# Patient Record
Sex: Female | Born: 2000 | Race: Black or African American | Hispanic: No | Marital: Single
Health system: Southern US, Community
[De-identification: ages and names within clinical notes are randomized; demographics above are authoritative.]

## PROBLEM LIST (undated history)

## (undated) ENCOUNTER — Emergency Department (HOSPITAL_COMMUNITY)

## (undated) DIAGNOSIS — F329 Major depressive disorder, single episode, unspecified: Secondary | ICD-10-CM

## (undated) DIAGNOSIS — F4320 Adjustment disorder, unspecified: Secondary | ICD-10-CM

## (undated) DIAGNOSIS — F909 Attention-deficit hyperactivity disorder, unspecified type: Secondary | ICD-10-CM

## (undated) DIAGNOSIS — Z7289 Other problems related to lifestyle: Secondary | ICD-10-CM

## (undated) DIAGNOSIS — R45851 Suicidal ideations: Secondary | ICD-10-CM

## (undated) DIAGNOSIS — N390 Urinary tract infection, site not specified: Secondary | ICD-10-CM

## (undated) HISTORY — DX: Adjustment disorder, unspecified: F43.20

## (undated) HISTORY — DX: Attention-deficit hyperactivity disorder, unspecified type: F90.9

---
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
## 2022-01-13 ENCOUNTER — Ambulatory Visit: Payer: Medicaid Other | Admitting: Pediatrics

## 2022-04-09 ENCOUNTER — Encounter (HOSPITAL_COMMUNITY): Payer: Self-pay | Admitting: Emergency Medicine

## 2022-04-09 ENCOUNTER — Emergency Department (HOSPITAL_COMMUNITY)
Admission: EM | Admit: 2022-04-09 | Discharge: 2022-04-09 | Disposition: A | Discharge status: Home / Self Care | Payer: Medicaid Other | Attending: Emergency Medicine | Admitting: Emergency Medicine

## 2022-04-09 ENCOUNTER — Emergency Department (HOSPITAL_COMMUNITY): Payer: Medicaid Other

## 2022-04-09 ENCOUNTER — Other Ambulatory Visit: Payer: Self-pay

## 2022-04-09 DIAGNOSIS — R0981 Nasal congestion: Secondary | ICD-10-CM | POA: Diagnosis not present

## 2022-04-09 DIAGNOSIS — R1031 Right lower quadrant pain: Secondary | ICD-10-CM | POA: Diagnosis not present

## 2022-04-09 DIAGNOSIS — R059 Cough, unspecified: Secondary | ICD-10-CM | POA: Diagnosis not present

## 2022-04-09 DIAGNOSIS — D252 Subserosal leiomyoma of uterus: Secondary | ICD-10-CM | POA: Diagnosis not present

## 2022-04-09 DIAGNOSIS — R1030 Lower abdominal pain, unspecified: Secondary | ICD-10-CM | POA: Diagnosis not present

## 2022-04-09 DIAGNOSIS — R0989 Other specified symptoms and signs involving the circulatory and respiratory systems: Secondary | ICD-10-CM | POA: Diagnosis not present

## 2022-04-09 DIAGNOSIS — Z20822 Contact with and (suspected) exposure to covid-19: Secondary | ICD-10-CM | POA: Diagnosis not present

## 2022-04-09 DIAGNOSIS — R197 Diarrhea, unspecified: Secondary | ICD-10-CM | POA: Diagnosis not present

## 2022-04-09 DIAGNOSIS — N83201 Unspecified ovarian cyst, right side: Secondary | ICD-10-CM

## 2022-04-09 DIAGNOSIS — K921 Melena: Secondary | ICD-10-CM | POA: Insufficient documentation

## 2022-04-09 DIAGNOSIS — R110 Nausea: Secondary | ICD-10-CM | POA: Insufficient documentation

## 2022-04-09 LAB — URINALYSIS, ROUTINE W REFLEX MICROSCOPIC
Bilirubin Urine: NEGATIVE
Glucose, UA: NEGATIVE mg/dL
Hgb urine dipstick: NEGATIVE
Ketones, ur: NEGATIVE mg/dL
Leukocytes,Ua: NEGATIVE
Nitrite: NEGATIVE
Protein, ur: NEGATIVE mg/dL
Specific Gravity, Urine: 1.016 (ref 1.005–1.030)
pH: 6 (ref 5.0–8.0)

## 2022-04-09 LAB — CBC
HCT: 35.2 % — ABNORMAL LOW (ref 36.0–46.0)
Hemoglobin: 12.3 g/dL (ref 12.0–15.0)
MCH: 24.7 pg — ABNORMAL LOW (ref 26.0–34.0)
MCHC: 34.9 g/dL (ref 30.0–36.0)
MCV: 70.7 fL — ABNORMAL LOW (ref 80.0–100.0)
Platelets: 400 10*3/uL (ref 150–400)
RBC: 4.98 MIL/uL (ref 3.87–5.11)
RDW: 15.9 % — ABNORMAL HIGH (ref 11.5–15.5)
WBC: 8.2 10*3/uL (ref 4.0–10.5)
nRBC: 0 % (ref 0.0–0.2)

## 2022-04-09 LAB — COMPREHENSIVE METABOLIC PANEL
ALT: 12 U/L (ref 0–44)
AST: 14 U/L — ABNORMAL LOW (ref 15–41)
Albumin: 3.8 g/dL (ref 3.5–5.0)
Alkaline Phosphatase: 56 U/L (ref 38–126)
Anion gap: 8 (ref 5–15)
BUN: 7 mg/dL (ref 6–20)
CO2: 23 mmol/L (ref 22–32)
Calcium: 9.6 mg/dL (ref 8.9–10.3)
Chloride: 107 mmol/L (ref 98–111)
Creatinine, Ser: 0.76 mg/dL (ref 0.44–1.00)
GFR, Estimated: >60 mL/min (ref >60)
Glucose, Bld: 92 mg/dL (ref 70–99)
Potassium: 4.4 mmol/L (ref 3.5–5.1)
Sodium: 138 mmol/L (ref 135–145)
Total Bilirubin: 0.4 mg/dL (ref 0.3–1.2)
Total Protein: 6.9 g/dL (ref 6.5–8.1)

## 2022-04-09 LAB — I-STAT BETA HCG BLOOD, ED (MC, WL, AP ONLY): I-stat hCG, quantitative: <5 m[IU]/mL (ref <5)

## 2022-04-09 LAB — SARS CORONAVIRUS 2 BY RT PCR: SARS Coronavirus 2 by RT PCR: NEGATIVE

## 2022-04-09 LAB — POC OCCULT BLOOD, ED: Fecal Occult Bld: NEGATIVE

## 2022-04-09 LAB — LIPASE, BLOOD: Lipase: 29 U/L (ref 11–51)

## 2022-04-09 MED ORDER — SODIUM CHLORIDE 0.9 % IV BOLUS
1000.0000 mL | Freq: Once | INTRAVENOUS | Status: AC
  Administered 2022-04-09: 1000 mL via INTRAVENOUS

## 2022-04-09 MED ORDER — ONDANSETRON HCL 4 MG/2ML IJ SOLN
4.0000 mg | Freq: Once | INTRAMUSCULAR | Status: AC
  Administered 2022-04-09: 4 mg via INTRAVENOUS
  Filled 2022-04-09: qty 2

## 2022-04-09 MED ORDER — DICYCLOMINE HCL 10 MG PO CAPS
10.0000 mg | ORAL_CAPSULE | Freq: Once | ORAL | Status: AC
  Administered 2022-04-09: 10 mg via ORAL
  Filled 2022-04-09: qty 1

## 2022-04-09 MED ORDER — IOHEXOL 300 MG/ML  SOLN
100.0000 mL | Freq: Once | INTRAMUSCULAR | Status: AC | PRN
Start: 2022-04-09 — End: 2022-04-09
  Administered 2022-04-09: 100 mL via INTRAVENOUS

## 2022-04-09 NOTE — Discharge Instructions (Addendum)
You were seen in the ER for evaluation of your abdominal pain.  You have a large hemorrhagic cyst on your right ovary which is likely causing your pain.  This will cause pain for the next few days to up to a week.  For pain, you can take Tylenol 1000 mg and ibuprofen 600 mg every 6 hours as needed for pain.  You can also apply heating pads and warm compresses to the area to help with pain.  I given you a work note for the next few days.  If you have worsening pain, pain with urination, blood in your urine, nausea, vomiting, fever, please return to the nearest emergency department for reevaluation.  Contact a doctor if: Your periods: Are late. Are not regular. Stop. Are painful. You have pain in the area between your hip bones, and the pain does not go away. You feel pressure on your bladder. You have trouble peeing. You feel full, or your belly hurts, swells, or bloats. You gain or lose weight without trying, or you are less hungry than normal. You feel pain and pressure in your back. You feel pain and pressure in the area between your hip bones. You think you may be pregnant. Get help right away if: You have pain in your belly that is very bad or gets worse. You have pain in the area between your hip bones, and the pain is very bad or gets worse. You cannot eat or drink without vomiting. You get a fever or chills all of a sudden. Your period is a lot heavier than usual.

## 2022-04-09 NOTE — ED Provider Notes (Signed)
Physical Exam  BP 121/80   Pulse 66   Temp 97.8 F (36.6 C) (Oral)   Resp 17   Ht 4\' 11"  (1.499 m)   Wt 83.9 kg   LMP 03/17/2022   SpO2 100%   Breastfeeding Unknown   BMI 37.37 kg/m   Physical Exam Vitals and nursing note reviewed.  Constitutional:      Appearance: Normal appearance.  Eyes:     General: No scleral icterus. Pulmonary:     Effort: Pulmonary effort is normal. No respiratory distress.  Abdominal:     Tenderness: There is abdominal tenderness in the right lower quadrant. There is no guarding or rebound.  Skin:    General: Skin is dry.     Findings: No rash.  Neurological:     General: No focal deficit present.     Mental Status: She is alert. Mental status is at baseline.  Psychiatric:        Mood and Affect: Mood normal.     Procedures  Procedures  ED Course / MDM   Clinical Course as of 04/09/22 1715  Sun Apr 09, 2022  1427 Pt re-evaluated and noted improvement of symptoms with treatment regimen in the ED. [SB]  1530 Discussed with patient plans for ultrasound, answered all available questions.  Patient agreeable at this time. [SB]    Clinical Course User Index [SB] Blue, Soijett A, PA-C   Medical Decision Making Amount and/or Complexity of Data Reviewed Labs: ordered. Radiology: ordered.  Risk Prescription drug management.   Accepted handoff at shift change from [facility]. Please see prior provider note for more detail.   Briefly: Patient is 21 y.o. F presenting with URI and lower abdominal pain.  DDX: concern for ovarian cyst.  Plan: F/u on 12 imaging.   Ultrasound imaging shows 3.9 cm hemorrhagic cyst in the right ovary Nonspecific small volume free fluid in the pelvis, likely physiologic.  Spoke with Dr. [place] with OBGYN that reports that she was safe for discharge.   Stable vital signs, no anemia.  No electrolyte abnormality.  hCG negative.  COVID-negative.  Patient's well-appearing with stable vital signs, safe for  discharge.  I discussed the patient's imaging with her.  Discussed that this will likely cause pain after a few days to a week.  She can take Tylenol or ibuprofen for the pain and apply warm compresses.  We discussed with her to follow-up with her PCP within the next week.  We discussed return precautions red flag symptoms.  Patient verbalized understanding and agrees to the plan.  Patient is stable being discharged home in good condition.   Results for orders placed or performed during the hospital encounter of 04/09/22  SARS Coronavirus 2 by RT PCR (hospital order, performed in [facility] hospital lab) *cepheid single result test* Anterior Nasal Swab   Specimen: Anterior Nasal Swab  Result Value Ref Range   SARS Coronavirus 2 by RT PCR NEGATIVE NEGATIVE  Lipase, blood  Result Value Ref Range   Lipase 29 11 - 51 U/L  Comprehensive metabolic panel  Result Value Ref Range   Sodium 138 135 - 145 mmol/L   Potassium 4.4 3.5 - 5.1 mmol/L   Chloride 107 98 - 111 mmol/L   CO2 23 22 - 32 mmol/L   Glucose, Bld 92 70 - 99 mg/dL   BUN 7 6 - 20 mg/dL   Creatinine, Ser [facility] 0.44 - 1.00 mg/dL   Calcium 9.6 8.9 - 0.62 mg/dL  Total Protein 6.9 6.5 - 8.1 g/dL   Albumin 3.8 3.5 - 5.0 g/dL   AST 14 (L) 15 - 41 U/L   ALT 12 0 - 44 U/L   Alkaline Phosphatase 56 38 - 126 U/L   Total Bilirubin 0.4 0.3 - 1.2 mg/dL   GFR, Estimated >84 >66 mL/min   Anion gap 8 5 - 15  CBC  Result Value Ref Range   WBC 8.2 4.0 - 10.5 K/uL   RBC 4.98 3.87 - 5.11 MIL/uL   Hemoglobin 12.3 12.0 - 15.0 g/dL   HCT 59.9 (L) 35.7 - 01.7 %   MCV 70.7 (L) 80.0 - 100.0 fL   MCH 24.7 (L) 26.0 - 34.0 pg   MCHC 34.9 30.0 - 36.0 g/dL   RDW 79.3 (H) 90.3 - 00.9 %   Platelets 400 150 - 400 K/uL   nRBC 0.0 0.0 - 0.2 %  Urinalysis, Routine w reflex microscopic Urine, Clean Catch  Result Value Ref Range   Color, Urine YELLOW YELLOW   APPearance HAZY (A) CLEAR   Specific Gravity, Urine 1.016 1.005 - 1.030   pH 6.0 5.0 - 8.0    Glucose, UA NEGATIVE NEGATIVE mg/dL   Hgb urine dipstick NEGATIVE NEGATIVE   Bilirubin Urine NEGATIVE NEGATIVE   Ketones, ur NEGATIVE NEGATIVE mg/dL   Protein, ur NEGATIVE NEGATIVE mg/dL   Nitrite NEGATIVE NEGATIVE   Leukocytes,Ua NEGATIVE NEGATIVE  I-Stat beta hCG blood, ED  Result Value Ref Range   I-stat hCG, quantitative <5.0 <5 mIU/mL   Comment 3          POC occult blood, ED  Result Value Ref Range   Fecal Occult Bld NEGATIVE NEGATIVE   US Pelvis Complete  Result Date: 04/09/2022 CLINICAL DATA:  Lower abdominal pain EXAM: TRANSABDOMINAL AND TRANSVAGINAL ULTRASOUND OF PELVIS TECHNIQUE: Both transabdominal and transvaginal ultrasound examinations of the pelvis were performed. Transabdominal technique was performed for global imaging of the pelvis including uterus, ovaries, adnexal regions, and pelvic cul-de-sac. It was necessary to proceed with endovaginal exam following the transabdominal exam to visualize the ovaries/adnexa and uterus/endometrium. COMPARISON:  Ultrasound 12/12/2021 FINDINGS: Uterus Measurements: 7.5 x 3.2 x 4.1 cm = volume: 52.3 mL. No fibroids or other mass visualized. Endometrium Thickness: 13.4 mm. There is a 6 mm subserosal uterine fibroid at the posterior fundus. Right ovary Measurements: 5.6 x 4.0 x 4.3 cm = volume: 49.1 mL. There is a 3.9 x 3.3 x 3.3 cm complex cyst with internal reticular echoes and a probable fluid-fluid level consistent with a hemorrhagic cyst. Left ovary Measurements: 4.9 x 1.6 x 1.9 cm = volume: 7.4 mL. Normal appearance/no adnexal mass. Other findings Small volume nonspecific free fluid in the pelvis. Fluid appears simple. IMPRESSION: 3.9 cm hemorrhagic cyst in the right ovary Nonspecific small volume free fluid in the pelvis, likely physiologic. Electronically Signed   By: Caprice Renshaw M.D.   On: 04/09/2022 16:51   US Transvaginal Non-OB  Result Date: 04/09/2022 CLINICAL DATA:  Lower abdominal pain EXAM: TRANSABDOMINAL AND TRANSVAGINAL  ULTRASOUND OF PELVIS TECHNIQUE: Both transabdominal and transvaginal ultrasound examinations of the pelvis were performed. Transabdominal technique was performed for global imaging of the pelvis including uterus, ovaries, adnexal regions, and pelvic cul-de-sac. It was necessary to proceed with endovaginal exam following the transabdominal exam to visualize the ovaries/adnexa and uterus/endometrium. COMPARISON:  Ultrasound 12/12/2021 FINDINGS: Uterus Measurements: 7.5 x 3.2 x 4.1 cm = volume: 52.3 mL. No fibroids or other mass visualized. Endometrium Thickness: 13.4 mm. There  is a 6 mm subserosal uterine fibroid at the posterior fundus. Right ovary Measurements: 5.6 x 4.0 x 4.3 cm = volume: 49.1 mL. There is a 3.9 x 3.3 x 3.3 cm complex cyst with internal reticular echoes and a probable fluid-fluid level consistent with a hemorrhagic cyst. Left ovary Measurements: 4.9 x 1.6 x 1.9 cm = volume: 7.4 mL. Normal appearance/no adnexal mass. Other findings Small volume nonspecific free fluid in the pelvis. Fluid appears simple. IMPRESSION: 3.9 cm hemorrhagic cyst in the right ovary Nonspecific small volume free fluid in the pelvis, likely physiologic. Electronically Signed   By: Caprice Renshaw M.D.   On: 04/09/2022 16:51   CT ABDOMEN PELVIS W CONTRAST  Result Date: 04/09/2022 CLINICAL DATA:  Lower abdominal pain and diarrhea for 1 week, dysuria EXAM: CT ABDOMEN AND PELVIS WITH CONTRAST TECHNIQUE: Multidetector CT imaging of the abdomen and pelvis was performed using the standard protocol following bolus administration of intravenous contrast. RADIATION DOSE REDUCTION: This exam was performed according to the departmental dose-optimization program which includes automated exposure control, adjustment of the mA and/or kV according to patient size and/or use of iterative reconstruction technique. CONTRAST:  OMNIPAQUE IOHEXOL 300 MG/ML  SOLN COMPARISON:  None Available. FINDINGS: Lower chest: No acute abnormality.  Hepatobiliary: No solid liver abnormality is seen. No gallstones, gallbladder wall thickening, or biliary dilatation. Pancreas: Unremarkable. No pancreatic ductal dilatation or surrounding inflammatory changes. Spleen: Normal in size without significant abnormality. Adrenals/Urinary Tract: Adrenal glands are unremarkable. Kidneys are normal, without renal calculi, solid lesion, or hydronephrosis. Bladder is unremarkable. Stomach/Bowel: Stomach is within normal limits. Appendix appears normal. No evidence of bowel wall thickening, distention, or inflammatory changes. Vascular/Lymphatic: No significant vascular findings are present. No enlarged abdominal or pelvic lymph nodes. Reproductive: No mass mass. Right ovarian cyst measuring 3.6 x 3.2 cm, with layering internal fluid fluid level (series 3, image 72, series 6, image 48). Other: No abdominal wall hernia or abnormality. Small volume free fluid in the low pelvis (series 3, image 75). Musculoskeletal: No acute or significant osseous findings. IMPRESSION: 1. Right ovarian cyst measuring 3.6 x 3.2 cm, with layering internal fluid fluid level. This may represent a hemorrhagic cyst or endometrioma and can be further evaluated by ultrasound if current presentation of abdominal pain is felt referable. 2. Small volume free fluid in the low pelvis, likely physiologic in the reproductive age setting. 3. No other findings to explain pain. Electronically Signed   By: Jearld Lesch M.D.   On: 04/09/2022 15:14          Achille Rich, PA-C 04/09/22 1846    Ernie Avena, MD 04/09/22 [phone]

## 2022-04-09 NOTE — ED Provider Notes (Signed)
[facility] EMERGENCY DEPARTMENT Provider Note   CSN: [id number] Arrival date & time: 04/09/22  1137     History  Chief Complaint  Patient presents with   Abdominal Pain    Angela Lara is a 21 y.o. female who presents to the emergency department with concerns for lower abdominal cramping onset 1 week.  Notes that she has sick contacts at home with her family members.  Has associated nasal congestion, rhinorrhea, cough, diarrhea, nausea.  Has tried TheraFlu at home with relief of her symptoms.  She also notes an episode of blood in the stool with wiping.  Denies history of hemorrhoids.  Denies vaginal bleeding, vaginal discharge, fever, hematuria.   The history is provided by the patient. No language interpreter was used.       Home Medications Prior to Admission medications   Medication Sig Start Date End Date Taking? Authorizing Provider  ibuprofen (ADVIL) 200 MG tablet Take 200 mg by mouth every 6 (six) hours as needed for moderate pain.   Yes [provider]  ibuprofen (ADVIL) 800 MG tablet Take 1 tablet (800 mg total) by mouth 3 (three) times daily with meals as needed for headache, moderate pain or cramping. Patient not taking: Reported on 04/09/2022 12/12/21   Milas Hock, MD  oxyCODONE (ROXICODONE) 5 MG immediate release tablet Take 1 tablet (5 mg total) by mouth every 4 (four) hours as needed for severe pain. Patient not taking: Reported on 04/09/2022 12/12/21   Milas Hock, MD      Allergies    Rosanne Ashing allergy]    Review of Systems   Review of Systems  Constitutional:  Negative for fever.  HENT:  Positive for congestion and rhinorrhea.   Respiratory:  Positive for cough.   Gastrointestinal:  Positive for abdominal pain, blood in stool, diarrhea and nausea. Negative for vomiting.  Genitourinary:  Positive for dysuria. Negative for hematuria, vaginal bleeding and vaginal discharge.  All other systems reviewed and are  negative.   Physical Exam Updated Vital Signs BP (!) 131/95 (BP Location: Right Arm)   Pulse 87   Temp 98.5 F (36.9 C) (Oral)   Resp 16   Ht 4\' 11"  (1.499 m)   Wt 83.9 kg   LMP 03/17/2022   SpO2 100%   Breastfeeding Unknown   BMI 37.37 kg/m  Physical Exam Vitals and nursing note reviewed.  Constitutional:      General: She is not in acute distress.    Appearance: She is not diaphoretic.  HENT:     Head: Normocephalic and atraumatic.     Mouth/Throat:     Pharynx: No oropharyngeal exudate.  Eyes:     General: No scleral icterus.    Conjunctiva/sclera: Conjunctivae normal.  Cardiovascular:     Rate and Rhythm: Normal rate and regular rhythm.     Pulses: Normal pulses.     Heart sounds: Normal heart sounds.  Pulmonary:     Effort: Pulmonary effort is normal. No respiratory distress.     Breath sounds: Normal breath sounds. No wheezing.  Abdominal:     General: Bowel sounds are normal.     Palpations: Abdomen is soft. There is no mass.     Tenderness: There is no abdominal tenderness. There is no guarding or rebound.     Comments: Diffuse abdominal tenderness to palpation.  Musculoskeletal:        General: Normal range of motion.     Cervical back: Normal range of motion and  neck supple.  Skin:    General: Skin is warm and dry.  Neurological:     Mental Status: She is alert.  Psychiatric:        Behavior: Behavior normal.     ED Results / Procedures / Treatments   Labs (all labs ordered are listed, but only abnormal results are displayed) Labs Reviewed  COMPREHENSIVE METABOLIC PANEL - Abnormal; Notable for the following components:      Result Value   AST 14 (*)    All other components within normal limits  CBC - Abnormal; Notable for the following components:   HCT 35.2 (*)    MCV 70.7 (*)    MCH 24.7 (*)    RDW 15.9 (*)    All other components within normal limits  URINALYSIS, ROUTINE W REFLEX MICROSCOPIC - Abnormal; Notable for the following  components:   APPearance HAZY (*)    All other components within normal limits  SARS CORONAVIRUS 2 BY RT PCR  LIPASE, BLOOD  I-STAT BETA HCG BLOOD, ED ([facility], [facility], [facility] ONLY)  POC OCCULT BLOOD, ED    EKG None  Radiology CT ABDOMEN PELVIS W CONTRAST  Result Date: 04/09/2022 CLINICAL DATA:  Lower abdominal pain and diarrhea for 1 week, dysuria EXAM: CT ABDOMEN AND PELVIS WITH CONTRAST TECHNIQUE: Multidetector CT imaging of the abdomen and pelvis was performed using the standard protocol following bolus administration of intravenous contrast. RADIATION DOSE REDUCTION: This exam was performed according to the departmental dose-optimization program which includes automated exposure control, adjustment of the mA and/or kV according to patient size and/or use of iterative reconstruction technique. CONTRAST:  OMNIPAQUE IOHEXOL 300 MG/ML  SOLN COMPARISON:  None Available. FINDINGS: Lower chest: No acute abnormality. Hepatobiliary: No solid liver abnormality is seen. No gallstones, gallbladder wall thickening, or biliary dilatation. Pancreas: Unremarkable. No pancreatic ductal dilatation or surrounding inflammatory changes. Spleen: Normal in size without significant abnormality. Adrenals/Urinary Tract: Adrenal glands are unremarkable. Kidneys are normal, without renal calculi, solid lesion, or hydronephrosis. Bladder is unremarkable. Stomach/Bowel: Stomach is within normal limits. Appendix appears normal. No evidence of bowel wall thickening, distention, or inflammatory changes. Vascular/Lymphatic: No significant vascular findings are present. No enlarged abdominal or pelvic lymph nodes. Reproductive: No mass mass. Right ovarian cyst measuring 3.6 x 3.2 cm, with layering internal fluid fluid level (series 3, image 72, series 6, image 48). Other: No abdominal wall hernia or abnormality. Small volume free fluid in the low pelvis (series 3, image 75). Musculoskeletal: No acute or significant osseous findings.  IMPRESSION: 1. Right ovarian cyst measuring 3.6 x 3.2 cm, with layering internal fluid fluid level. This may represent a hemorrhagic cyst or endometrioma and can be further evaluated by ultrasound if current presentation of abdominal pain is felt referable. 2. Small volume free fluid in the low pelvis, likely physiologic in the reproductive age setting. 3. No other findings to explain pain. Electronically Signed   By: Jearld Lesch M.D.   On: 04/09/2022 15:14    Procedures Procedures    Medications Ordered in ED Medications  dicyclomine (BENTYL) capsule 10 mg (10 mg Oral Given 04/09/22 1336)  ondansetron (ZOFRAN) injection 4 mg (4 mg Intravenous Given 04/09/22 1333)  sodium chloride 0.9 % bolus 1,000 mL (1,000 mLs Intravenous New Bag/Given 04/09/22 1333)  iohexol (OMNIPAQUE) 300 MG/ML solution 100 mL (100 mLs Intravenous Contrast Given 04/09/22 1503)    ED Course/ Medical Decision Making/ A&P Clinical Course as of 04/09/22 1547  Sun Apr 09, 2022  1427  Pt re-evaluated and noted improvement of symptoms with treatment regimen in the ED. [SB]  1530 Discussed with patient plans for ultrasound, answered all available questions.  Patient agreeable at this time. [SB]    Clinical Course User Index [SB] Haidar Muse A, PA-C                           Medical Decision Making Amount and/or Complexity of Data Reviewed Labs: ordered. Radiology: ordered.  Risk Prescription drug management.   Patient presents to the emergency department with lower abdominal pain onset 1 week.  Has associated diarrhea.  Notes feeling members at home sick with similar symptoms.  Has tried ibuprofen without relief of her symptoms.  Also complains of URI symptoms and has tried TheraFlu with relief of her symptoms.  Patient afebrile.  On exam patient with diffuse abdominal tenderness to palpation, more so to the lower abdominal regions.  No acute cardiovascular respiratory exam findings.  Differential diagnosis includes  appendicitis, acute cystitis, ovarian cyst, URI, COVID.   Labs:  I ordered, and personally interpreted labs.  The pertinent results include:  Lipase unremarkable at 29 CBC without leukocytosis and unremarkable CMP unremarkable I-stat beta hCG negative COVID swab negative Hemoccult negative  Imaging: I ordered imaging studies including CT abdomen pelvis with I independently visualized and interpreted imaging which showed  1. Right ovarian cyst measuring 3.6 x 3.2 cm, with layering internal  fluid fluid level. This may represent a hemorrhagic cyst or  endometrioma and can be further evaluated by ultrasound if current  presentation of abdominal pain is felt referable.  2. Small volume free fluid in the low pelvis, likely physiologic in  the reproductive age setting.  3. No other findings to explain pain.   US Pelvis, transvaginal ordered with results pending at time of sign-out.  I agree with the radiologist interpretation  Medications:  I ordered medication including bentyl, zofran, IVF for symptom management.  Reevaluation of the patient after these medicines and interventions, I reevaluated the patient and found that they have improved I have reviewed the patients home medicines and have made adjustments as needed  Patient case discussed with Achille Rich, PA-C at sign-out. Plan at sign-out is pending [place] and pain management, likely Discharge home, however, plans may change as per oncoming team. Patient care transferred at sign out.    This chart was dictated using voice recognition software, Dragon. Despite the best efforts of this provider to proofread and correct errors, errors may still occur which can change documentation meaning.   Final Clinical Impression(s) / ED Diagnoses Final diagnoses:  None    Rx / DC Orders ED Discharge Orders     None         Amalio Loe A, PA-C 04/09/22 1611    Ernie Avena, MD 04/09/22 1858

## 2022-04-09 NOTE — ED Notes (Signed)
Patient verbalizes understanding of discharge instructions. Opportunity for questioning and answers were provided. Armband removed by staff, pt discharged from ED. Pt ambulatory to ED waiting room with steady gait.  

## 2022-04-09 NOTE — ED Triage Notes (Signed)
Patient arrives ambulatory by POV c/o lower abdominal cramping along with diarrhea x 1 week. Reports some dysuria. No relief from ibuprofen.

## 2022-04-17 ENCOUNTER — Ambulatory Visit (INDEPENDENT_AMBULATORY_CARE_PROVIDER_SITE_OTHER): Payer: Medicaid Other | Admitting: Pediatrics

## 2022-04-17 ENCOUNTER — Encounter: Payer: Self-pay | Admitting: Pediatrics

## 2022-04-17 VITALS — BP 116/82 | Wt 188.8 lb

## 2022-04-17 DIAGNOSIS — R1030 Lower abdominal pain, unspecified: Secondary | ICD-10-CM

## 2022-04-17 DIAGNOSIS — Z7187 Encounter for pediatric-to-adult transition counseling: Secondary | ICD-10-CM

## 2022-04-17 DIAGNOSIS — N83209 Unspecified ovarian cyst, unspecified side: Secondary | ICD-10-CM

## 2022-04-17 LAB — POCT HEMOGLOBIN: Hemoglobin: 12 g/dL (ref 11–14.6)

## 2022-04-17 MED ORDER — ACETAMINOPHEN 500 MG PO TABS: ORAL_TABLET | ORAL | 0 refills | Status: DC

## 2022-04-17 NOTE — Patient Instructions (Addendum)
I will place referral to Gynecology for you to have follow up appointment.  I am also going to refer you to Internal Medicine Clinic to transfer care there and away from Pediatrics.  They will reach out to you for an appointment.  You are still a patient here in pediatrics until you get to see the new doctor.  Please get a flu vaccine - you can get this at your local pharmacy.  Hemoglobin today is normal.  Results for orders placed or performed in visit on 04/17/22 (from the past 72 hour(s))  POCT hemoglobin     Status: Normal   Collection Time: 04/17/22  3:19 PM  Result Value Ref Range   Hemoglobin 12.0 11 - 14.6 g/dL

## 2022-04-17 NOTE — Progress Notes (Signed)
Subjective:    Patient ID: Angela Lara, female    DOB: 13-Apr-2001, 21 y.o.   MRN: [id number]  HPI Chief Complaint  Patient presents with   Follow-up    Angela Lara is here for follow up after ED visit on Apr 09, 2022 for abdominal pain.  She was found to have a right hemorrhagic ovarian cyst, given pain med and advised on follow up.  EHR record of the ED visit and associated labs is reviewed by this physician.  Her preferred phone number is [phone]  Angela Lara states she is feeling better but still has pain.  States pain yesterday was severe and she took Tylenol #1 x one dose with relief; better today.  No other meds or modifying factors.  Localizes pain to her left now and at ED presentation.  No fever, vomiting or diarrhea.  Sexually active - no contraception Same boyfriend x 2 years Last sexual activity 04/01/2022 No STI screening done in ED  LMP 9/9 and varies on duration - used to be 5 days  She had gyn surgery Dec 12, 2021 (DIAGNOSTIC LAPAROSCOPY WITH REMOVAL OF ECTOPIC PREGNANCY with Angela Hock, MD); however, she does not receive routine gynecologic care. Hospitalized 05/07/2017 with MDD.  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   Review of Systems As noted     Objective:   Physical Exam Vitals and nursing note reviewed.  Constitutional:      Appearance: Normal appearance.  Cardiovascular:     Rate and Rhythm: Normal rate and regular rhythm.     Pulses: Normal pulses.     Heart sounds: Normal heart sounds.  Pulmonary:     Effort: Pulmonary effort is normal. No respiratory distress.     Breath sounds: Normal breath sounds.  Abdominal:     General: Bowel sounds are normal. There is no distension.     Palpations: Abdomen is soft. There is no mass.      Tenderness: There is abdominal tenderness (tender to palpation in the left lower quad; also states referred discomfort in LLQ on deep palpation of right lower quad). There is no guarding or rebound.  Neurological:     Mental Status: She is alert.   Blood pressure 116/82, weight 188 lb 12.8 oz (85.6 kg), last menstrual period 03/15/2022, unknown if currently breastfeeding.   Results for orders placed or performed in visit on 04/17/22 (from the past 48 hour(s))  POCT hemoglobin     Status: Normal   Collection Time: 04/17/22  3:19 PM  Result Value Ref Range   Hemoglobin 12.0 11 - 14.6 g/dL       Assessment & Plan:   1. Hemorrhagic cyst of ovary   2. Lower abdominal pain   3. Counseling for transition from pediatric to adult care provider     Angela Lara presents with continued abdominal pain after diagnosis ([place] confirmation) of right hemorrhagic ovarian cyst.  She states pain severe at one point yesterday but today is ambulating with normal gait and no signs of extreme pain while in office.  She does have moderate pain of palpation in lower abd but localizes to opposite side than noted cyst.  She climbs up and down from exam table without noted difficulty. Hemoglobin is normal.  Discussed with Angela Lara she needs gyn follow up and regular  care; referral placed. Angela Lara against taking medication prescribed for other people and advised on acetaminophen for pain control (extra strength OTC of by prescription).  Adequate hydration and diet as tolerates.  Okay to work if feeling up to this - states she can sit when needed.  Counseled on need for contraception.  This can be managed by gyn or follow-up visit here. Also counseled on transition to adult care.  Angela Lara presents with skills needed to manage her own health care and is already setting appointments, managing meds, using MyChart and accessing own transportation.  Discussed some options and pt stated no specific adult care physician in mind.   Referred to Internal medicine.  Meds ordered this encounter  Medications   acetaminophen (TYLENOL) 500 MG tablet    Sig: Take 2 tablets by mouth every 6 hours if needed for pain;  Do not exceed 3 days of use without checking with doctor    Dispense:  30 tablet    Refill:  0    Orders Placed This Encounter  Procedures   Ambulatory referral to Gynecology    Referral Priority:   Routine    Referral Type:   Consultation    Referral Reason:   Specialty Services Required    Requested Specialty:   Gynecology    Number of Visits Requested:   1   Ambulatory referral to Internal Medicine    Referral Priority:   Routine    Referral Type:   Consultation    Referral Reason:   Specialty Services Required    Requested Specialty:   Internal Medicine    Number of Visits Requested:   1   POCT hemoglobin    Associate with Z13.0    Time spent reviewing documentation and services related to visit: 10 min Time spent face-to-face with patient for visit: 20 min Time spent not face-to-face with patient for documentation and care coordination: 10 min  Angela Erie, MD

## 2022-06-19 ENCOUNTER — Encounter: Payer: Self-pay | Admitting: Advanced Practice Midwife

## 2023-05-14 ENCOUNTER — Encounter: Payer: Self-pay | Admitting: Physician Assistant

## 2023-05-14 ENCOUNTER — Ambulatory Visit (INDEPENDENT_AMBULATORY_CARE_PROVIDER_SITE_OTHER): Payer: Medicaid Other | Admitting: Physician Assistant

## 2023-05-14 VITALS — BP 109/72 | HR 88 | Ht 59.0 in | Wt 176.4 lb

## 2023-05-14 DIAGNOSIS — Z23 Encounter for immunization: Secondary | ICD-10-CM

## 2023-05-14 DIAGNOSIS — Z0000 Encounter for general adult medical examination without abnormal findings: Secondary | ICD-10-CM | POA: Diagnosis not present

## 2023-05-14 DIAGNOSIS — Z113 Encounter for screening for infections with a predominantly sexual mode of transmission: Secondary | ICD-10-CM | POA: Diagnosis not present

## 2023-05-14 DIAGNOSIS — N809 Endometriosis, unspecified: Secondary | ICD-10-CM | POA: Diagnosis not present

## 2023-05-14 LAB — CBC WITH DIFFERENTIAL/PLATELET
Basophils Absolute: 0.1 10*3/uL (ref 0.0–0.1)
Basophils Relative: 0.9 % (ref 0.0–3.0)
Eosinophils Absolute: 0.1 10*3/uL (ref 0.0–0.7)
Eosinophils Relative: 0.9 % (ref 0.0–5.0)
HCT: 40 % (ref 36.0–46.0)
Hemoglobin: 13.1 g/dL (ref 12.0–15.0)
Lymphocytes Relative: 33.5 % (ref 12.0–46.0)
Lymphs Abs: 2.2 10*3/uL (ref 0.7–4.0)
MCHC: 32.8 g/dL (ref 30.0–36.0)
MCV: 79.3 fL (ref 78.0–100.0)
Monocytes Absolute: 0.4 10*3/uL (ref 0.1–1.0)
Monocytes Relative: 6 % (ref 3.0–12.0)
Neutro Abs: 3.8 10*3/uL (ref 1.4–7.7)
Neutrophils Relative %: 58.7 % (ref 43.0–77.0)
Platelets: 365 10*3/uL (ref 150.0–400.0)
RBC: 5.04 Mil/uL (ref 3.87–5.11)
RDW: 17.3 % — ABNORMAL HIGH (ref 11.5–14.6)
WBC: 6.5 10*3/uL (ref 4.5–10.5)

## 2023-05-14 LAB — COMPREHENSIVE METABOLIC PANEL
ALT: 7 U/L (ref 0–35)
AST: 9 U/L (ref 0–37)
Albumin: 4.2 g/dL (ref 3.5–5.2)
Alkaline Phosphatase: 59 U/L (ref 39–117)
BUN: 9 mg/dL (ref 6–23)
CO2: 24 meq/L (ref 19–32)
Calcium: 9.7 mg/dL (ref 8.4–10.5)
Chloride: 105 meq/L (ref 96–112)
Creatinine, Ser: 0.69 mg/dL (ref 0.40–1.20)
GFR: 124.74 mL/min (ref >60.00)
Glucose, Bld: 92 mg/dL (ref 70–99)
Potassium: 4.3 meq/L (ref 3.5–5.1)
Sodium: 137 meq/L (ref 135–145)
Total Bilirubin: 0.5 mg/dL (ref 0.2–1.2)
Total Protein: 7 g/dL (ref 6.0–8.3)

## 2023-05-14 LAB — LIPID PANEL
Cholesterol: 154 mg/dL (ref 0–200)
HDL: 35.1 mg/dL — ABNORMAL LOW (ref >39.00)
LDL Cholesterol: 104 mg/dL — ABNORMAL HIGH (ref 0–99)
NonHDL: 118.48
Total CHOL/HDL Ratio: 4
Triglycerides: 73 mg/dL (ref 0.0–149.0)
VLDL: 14.6 mg/dL (ref 0.0–40.0)

## 2023-05-14 LAB — HEMOGLOBIN A1C: Hgb A1c MFr Bld: 4.8 % (ref 4.6–6.5)

## 2023-05-14 NOTE — Progress Notes (Signed)
New patient visit   Patient: Angela Lara   DOB: 04-06-2001   22 y.o. Female  MRN: [id number] Visit Date: 05/14/2023  Today's healthcare provider: Alfredia Ferguson, PA-C   Cc. New patient, establish care  Subjective    Angela Lara is a G82P0 22 y.o. female who presents today as a new patient to establish care.  HPI   Discussed the use of AI scribe software for clinical note transcription with the patient, who gave verbal consent to proceed.  History of Present Illness   Angela Lara, a patient with a history of endometriosis and ovarian cysts, presents for a primary care visit after a gap in regular medical care. She has sought emergency care intermittently during this period but has not had a consistent primary care provider. She reports a history of painful and heavy periods, which she attributes to her endometriosis that was discovered during a lap for an ectopic pregnancy.  She is not currently using any form of birth control and has no immediate plans to start. She has recently moved back from [place] following the end of a relationship and is currently seeking employment. She has requested STD testing during this visit.       Past Medical History:  Diagnosis Date   ADHD    Adjustment disorder    Deliberate self-cutting    Major depression    Suicide attempt (HCC) 05/06/2017   Wellbutrin   Past Surgical History:  Procedure Laterality Date   DIAGNOSTIC LAPAROSCOPY WITH REMOVAL OF ECTOPIC PREGNANCY N/A 12/12/2021   Procedure: DIAGNOSTIC LAPAROSCOPY WITH REMOVAL OF ECTOPIC PREGNANCY AND RIGHT SALPINGECTOMY;  Surgeon: Milas Hock, MD;  Location: [facility] OR;  Service: Gynecology;  Laterality: N/A;   Family Status  Relation Name Status   Mother Angela Lara Alive   Father Angela Lara Deceased   Sister Angela Lara Alive   Brother Angela Lara Alive   Brother Angela Lara Alive  No partnership data on file   Family History  Problem Relation Age of Onset   Depression Father     Drug abuse Father    ADD / ADHD Brother    Social History   Socioeconomic History   Marital status: Single    Spouse name: Not on file   Number of children: Not on file   Years of education: Not on file   Highest education level: Not on file  Occupational History   Not on file  Tobacco Use   Smoking status: Never   Smokeless tobacco: Never  Vaping Use   Vaping status: Never Used  Substance and Sexual Activity   Alcohol use: No   Drug use: Yes    Frequency: 1.0 times per week    Types: Marijuana    Comment: once every 2 weeks   Sexual activity: Not Currently    Birth control/protection: None  Other Topics Concern   Not on file  Social History Narrative   Angela Lara lives with her mom and step dad and siblings.  8yo brother and twin sisters (5yo).   Father deceased 06/01/2016 with COD pending. (Overdose)    Patient states she was supposed to start therapy tomorrow.    Patient is in 8th grade at [employer].   Social Determinants of Health   Financial Resource Strain: Not on file  Food Insecurity: Not on file  Transportation Needs: Not on file  Physical Activity: Not on file  Stress: Not on file  Social Connections: Not on file  Outpatient Medications Prior to Visit  Medication Sig   [DISCONTINUED] acetaminophen (TYLENOL) 500 MG tablet Take 2 tablets by mouth every 6 hours if needed for pain;  Do not exceed 3 days of use without checking with doctor   [DISCONTINUED] ibuprofen (ADVIL) 200 MG tablet Take 200 mg by mouth every 6 (six) hours as needed for moderate pain.   [DISCONTINUED] ibuprofen (ADVIL) 800 MG tablet Take 1 tablet (800 mg total) by mouth 3 (three) times daily with meals as needed for headache, moderate pain or cramping. (Patient not taking: Reported on 04/09/2022)   No facility-administered medications prior to visit.   Allergies  Allergen Reactions   Peanut Butter Flavor [Flavoring Agent] Itching   Salmon [Fish Allergy]     Immunization  History  Administered Date(s) Administered   DTaP 12/02/2002, 02/02/2003, 04/06/2003, 06/21/2004, 06/06/2007   HIB (PRP-OMP) 12/02/2002, 02/02/2003, 04/06/2003, 06/21/2004   HPV 9-valent 06/02/2015   HPV Quadrivalent 11/28/2013, 01/30/2014   Hepatitis A 08/31/2010, 10/28/2012   Hepatitis B 07-18-2003, 12/02/2002, 06/30/2003, 07/15/2003   IPV 12/02/2002, 02/02/2003, 06/21/2004, 06/06/2007   Influenza, Seasonal, Injecte, Preservative Fre 05/14/2023   Influenza,inj,Quad PF,6+ Mos 06/02/2015, 06/16/2016, 05/07/2017, 07/09/2019   Influenza,inj,quad, With Preservative 11/28/2013   Influenza-Unspecified 05/28/2008, 08/31/2010   MMR 10/21/2003, 06/06/2007   Meningococcal Conjugate 11/28/2013, 07/09/2019   Pneumococcal Conjugate-13 12/02/2002, 02/02/2003, 04/06/2003, 06/30/2003   Pneumococcal-Unspecified 06/21/2004   Tdap 11/28/2013   Varicella 10/21/2003, 06/06/2007    Health Maintenance  Topic Date Due   Hepatitis C Screening  Never done   COVID-19 Vaccine (1 - 2023-24 season) Never done   DTaP/Tdap/Td (7 - Td or Tdap) 11/29/2023   INFLUENZA VACCINE  Completed   HPV VACCINES  Completed   HIV Screening  Completed    Patient Care Team: Alfredia Ferguson, PA-C as PCP - General (Physician Assistant)  Review of Systems  Constitutional:  Negative for fatigue and fever.  Respiratory:  Negative for cough and shortness of breath.   Cardiovascular:  Negative for chest pain and leg swelling.  Gastrointestinal:  Negative for abdominal pain.  Neurological:  Negative for dizziness and headaches.      Objective    BP 109/72   Pulse 88   Ht 4\' 11"  (1.499 m)   Wt 176 lb 6.4 oz (80 kg)   LMP 04/15/2023   SpO2 98%   BMI 35.63 kg/m    Physical Exam Constitutional:      General: She is awake.     Appearance: She is well-developed. She is not ill-appearing.  HENT:     Head: Normocephalic.     Right Ear: Tympanic membrane normal.     Left Ear: Tympanic membrane normal.     Nose: Nose  normal. No congestion or rhinorrhea.     Mouth/Throat:     Pharynx: No oropharyngeal exudate or posterior oropharyngeal erythema.  Eyes:     Conjunctiva/sclera: Conjunctivae normal.     Pupils: Pupils are equal, round, and reactive to light.  Neck:     Thyroid: No thyroid mass or thyromegaly.  Cardiovascular:     Rate and Rhythm: Normal rate and regular rhythm.     Heart sounds: Normal heart sounds.  Pulmonary:     Effort: Pulmonary effort is normal.     Breath sounds: Normal breath sounds.  Abdominal:     Palpations: Abdomen is soft.     Tenderness: There is no abdominal tenderness.  Musculoskeletal:     Right lower leg: No swelling. No edema.  Left lower leg: No swelling. No edema.  Lymphadenopathy:     Cervical: No cervical adenopathy.  Skin:    General: Skin is warm.  Neurological:     Mental Status: She is alert and oriented to person, place, and time.  Psychiatric:        Attention and Perception: Attention normal.        Mood and Affect: Mood normal.        Speech: Speech normal.        Behavior: Behavior normal. Behavior is cooperative.     Depression Screen    05/14/2023   10:28 AM 07/09/2019    3:26 PM 04/27/2017    1:57 PM  PHQ 2/9 Scores  PHQ - 2 Score 0 2 6  PHQ- 9 Score 4 6 24    No results found for any visits on 05/14/23.  Assessment & Plan     1. Annual physical exam  -Perform routine laboratory work -Initiate STD screening as requested by the patient. -Schedule annual physical examination and Pap smear for next year.    General recommendations: --balanced diet high in fiber and protein, low in sugars, carbs, fats. --physical activity/exercise 20-30 minutes 3-5 times a week   - CBC w/Diff - Lipid panel - Comp Met (CMET) - HgB A1c  2. Endometriosis History of painful and heavy periods, with a previous diagnosis of endometriosis. -Pt has upcoming appt with gyn   3. Need for influenza vaccination - Flu vaccine trivalent PF, 6mos and  older(Flulaval,Afluria,Fluarix,Fluzone)  4. Screen for STD (sexually transmitted disease) - RPR - Urine cytology ancillary only - Hepatitis C antibody - HIV antibody (with reflex)   Return in about 1 year (around 05/13/2024) for CPE.     I, Alfredia Ferguson, PA-C have reviewed all documentation for this visit. The documentation on  05/14/23   for the exam, diagnosis, procedures, and orders are all accurate and complete.    Alfredia Ferguson, PA-C  [facility] Primary [facility] at [facility] [phone] (phone) [phone] (fax)  [facility]

## 2023-05-16 LAB — RPR: RPR Ser Ql: NONREACTIVE

## 2023-05-16 LAB — HEPATITIS C ANTIBODY: Hepatitis C Ab: NONREACTIVE

## 2023-05-16 LAB — HIV ANTIBODY (ROUTINE TESTING W REFLEX): HIV 1&2 Ab, 4th Generation: NONREACTIVE

## 2023-06-11 ENCOUNTER — Ambulatory Visit: Payer: Medicaid Other | Admitting: Adult Health

## 2023-06-11 NOTE — Progress Notes (Signed)
Patient no show appointment. ? ?

## 2023-06-25 ENCOUNTER — Encounter: Payer: Self-pay | Admitting: Adult Health

## 2023-06-25 ENCOUNTER — Ambulatory Visit: Payer: Medicaid Other | Admitting: Adult Health

## 2023-06-25 VITALS — BP 135/85 | HR 85 | Ht 59.0 in | Wt 185.0 lb

## 2023-06-25 DIAGNOSIS — G4700 Insomnia, unspecified: Secondary | ICD-10-CM | POA: Diagnosis not present

## 2023-06-25 DIAGNOSIS — F3160 Bipolar disorder, current episode mixed, unspecified: Secondary | ICD-10-CM

## 2023-06-25 MED ORDER — TRAZODONE HCL 50 MG PO TABS
50.0000 mg | ORAL_TABLET | Freq: Every day | ORAL | 2 refills | Status: AC
Start: 2023-06-25 — End: ?

## 2023-06-25 MED ORDER — ARIPIPRAZOLE 2 MG PO TABS
2.0000 mg | ORAL_TABLET | Freq: Every day | ORAL | 2 refills | Status: AC
Start: 2023-06-25 — End: ?

## 2023-06-25 NOTE — Progress Notes (Signed)
[facility] MD/PA/NP Initial Note  06/25/2023 3:21 PM Angela Lara  MRN:  [id number]  Chief Complaint:   HPI:   Patient seen today for initial psychiatric evaluation.   Describes mood today as "not the best". Pleasant. Reports tearful at times. Mood symptoms - denies depression - very grateful for where she is and what she has. Reports anxiety - experiencing a lot of new things - reports a break up after 3 years - not a good situation. Reports situational irritability. Reports panic attacks - "on the nights I sleep on my own". Reports some worry, rumination, and over thinking at times. Made some impulsive decisions in October after moving to the area. Report being more active socially - talking to other people first. Reports not having a job to keep her mind and brain busy - "I worry about not finding a job". Would like to return to school. Stable interest and motivation - "I feel much more motivated". Taking medications as prescribed.  Energy levels vary - having random bursts and other days just lays around. Active, does not have a regular exercise routine.  Enjoys some usual interests and activities. Lives with mother, stepfather and 3 siblings. Spending time with family. Appetite adequate - eating twice a day. Weight fluctuates. Reports she sleeps better some nights than others. Reports waking up in a panic attack. Reports staying up all night and sleeping 2 hours during the day. Reports taking Hydroxyzine 3 days out of the week and sleeps 12 hours with it and wakes up drowsy. Reports difficulties with focus and concentration - remembers having issues in elementary school.  Reports struggling throughout school - worked hard to maintain a "C" average. Completing tasks. Managing aspects of household. Reports she is not employed, but is looking for a job. Would like to return to school.  Denies SI or HI.  Denies AH or VH. Denies self harm. Denies substance use.  Previous medication trials:   possible SSRI's, Lamictal, Hydroxyzine.  Visit Diagnosis:    ICD-10-CM   1. Bipolar I disorder, most recent episode mixed (HCC)  F31.60       Past Psychiatric History: Reports 2 psychiatric admissions in 8th or 9th grade.   Past Medical History:  Past Medical History:  Diagnosis Date   ADHD    Adjustment disorder    Deliberate self-cutting    Major depression    Suicide attempt (HCC) 05/06/2017   Wellbutrin    Past Surgical History:  Procedure Laterality Date   DIAGNOSTIC LAPAROSCOPY WITH REMOVAL OF ECTOPIC PREGNANCY N/A 12/12/2021   Procedure: DIAGNOSTIC LAPAROSCOPY WITH REMOVAL OF ECTOPIC PREGNANCY AND RIGHT SALPINGECTOMY;  Surgeon: Milas Hock, MD;  Location: [facility] OR;  Service: Gynecology;  Laterality: N/A;    Family Psychiatric History: Denies any family history of mental illness.   Family History:  Family History  Problem Relation Age of Onset   Depression Father    Drug abuse Father    ADD / ADHD Brother     Social History:  Social History   Socioeconomic History   Marital status: Single    Spouse name: Not on file   Number of children: Not on file   Years of education: Not on file   Highest education level: Not on file  Occupational History   Not on file  Tobacco Use   Smoking status: Never   Smokeless tobacco: Never  Vaping Use   Vaping status: Never Used  Substance and Sexual Activity   Alcohol use: No   Drug  use: Yes    Frequency: 1.0 times per week    Types: Marijuana    Comment: once every 2 weeks   Sexual activity: Not Currently    Birth control/protection: None  Other Topics Concern   Not on file  Social History Narrative   Angela Lara lives with her mom and step dad and siblings.  8yo brother and twin sisters (5yo).   Father deceased 06-10-16 with COD pending. (Overdose)    Patient states she was supposed to start therapy tomorrow.    Patient is in 8th grade at [employer].   Social Determinants of Health   Financial Resource  Strain: Not on file  Food Insecurity: Not on file  Transportation Needs: Not on file  Physical Activity: Not on file  Stress: Not on file  Social Connections: Not on file    Allergies:  Allergies  Allergen Reactions   Peanut Butter Flavor [Flavoring Agent] Itching   Salmon [Fish Allergy]     Metabolic Disorder Labs: Lab Results  Component Value Date   HGBA1C 4.8 05/14/2023   MPG 96.8 04/28/2017   Lab Results  Component Value Date   PROLACTIN 13.7 12/18/2018   PROLACTIN 18.0 04/29/2017   Lab Results  Component Value Date   CHOL 154 05/14/2023   TRIG 73.0 05/14/2023   HDL 35.10 (L) 05/14/2023   CHOLHDL 4 05/14/2023   VLDL 14.6 05/14/2023   LDLCALC 104 (H) 05/14/2023   LDLCALC 124 (H) 04/28/2017   Lab Results  Component Value Date   TSH 2.43 12/18/2018   TSH 2.022 04/28/2017    Therapeutic Level Labs: No results found for: "LITHIUM" No results found for: "VALPROATE" No results found for: "CBMZ"  Current Medications: Current Outpatient Medications  Medication Sig Dispense Refill   hydrOXYzine (VISTARIL) 25 MG capsule Take 25 mg by mouth at bedtime.     norelgestromin-ethinyl estradiol Burr Medico) 150-35 MCG/24HR transdermal patch Place onto the skin.     No current facility-administered medications for this visit.    Medication Side Effects: none  Orders placed this visit:  No orders of the defined types were placed in this encounter.   Psychiatric Specialty Exam:  Review of Systems  Musculoskeletal:  Negative for gait problem.  Neurological:  Negative for tremors.  Psychiatric/Behavioral:         Please refer to HPI    Blood pressure 135/85, pulse 85, height 4\' 11"  (1.499 m), weight 185 lb (83.9 kg), unknown if currently breastfeeding.Body mass index is 37.37 kg/m.  General Appearance: Casual and Neat  Eye Contact:  Good  Speech:  Clear and Coherent and Normal Rate  Volume:  Normal  Mood:  Anxious, Depressed, and Irritable  Affect:  Appropriate  and Congruent  Thought Process:  Coherent and Descriptions of Associations: Intact  Orientation:  Full (Time, Place, and Person)  Thought Content: Logical   Suicidal Thoughts:  No  Homicidal Thoughts:  No  Memory:  WNL  Judgement:  Good  Insight:  Good  Psychomotor Activity:  Normal  Concentration:  Concentration: Good and Attention Span: Good  Recall:  Good  Fund of Knowledge: Good  Language: Good  Assets:  Family support.  ADL's:  Intact  Cognition: WNL  Prognosis:  Good   Screenings:  GAD-7    Flowsheet Row Office Visit from 05/14/2023 in [facility] at [facility] from 04/27/2017 in [facility]  Total GAD-7 Score 5 14      PHQ2-9    Flowsheet Row Office Visit from 05/14/2023 in [facility] at [facility] Office Visit from 07/09/2019 in [facility] from 04/27/2017 in [facility]  PHQ-2 Total Score 0 2 6  PHQ-9 Total Score 4 6 24       Flowsheet Row ED from 04/09/2022 in [facility] Department at [facility] Admission (Discharged) from 12/12/2021 in [place] [facility] ED from 02/02/2021 in [facility] at [facility] RISK CATEGORY No Risk No Risk Error: Question 6 not populated       Receiving Psychotherapy: No   Treatment Plan/Recommendations:   Plan:  PDMP reviewed  Add Abilify 2mg  at hs Add Trazadone 50mg  at hs  Discontinue Hydroxyzine 25mg  at bedtime - has not been taking it.   RTC 4 weeks  Patient advised to contact office with any questions, adverse effects, or acute worsening in signs and symptoms.    Time spent with patient was 60 minutes. Greater than 50% of face to face time with patient was spent on counseling and coordination  of care.  Mother also present for planning and treatment plan.    Dorothyann Gibbs, NP

## 2023-07-23 ENCOUNTER — Ambulatory Visit (INDEPENDENT_AMBULATORY_CARE_PROVIDER_SITE_OTHER): Payer: Self-pay | Admitting: Adult Health

## 2023-07-23 DIAGNOSIS — Z0389 Encounter for observation for other suspected diseases and conditions ruled out: Secondary | ICD-10-CM

## 2023-07-23 NOTE — Progress Notes (Signed)
Patient no show appointment. ? ?

## 2024-03-18 DIAGNOSIS — N83201 Unspecified ovarian cyst, right side: Secondary | ICD-10-CM | POA: Diagnosis not present

## 2024-08-13 ENCOUNTER — Other Ambulatory Visit: Payer: Self-pay

## 2024-08-13 ENCOUNTER — Emergency Department (HOSPITAL_BASED_OUTPATIENT_CLINIC_OR_DEPARTMENT_OTHER)

## 2024-08-13 ENCOUNTER — Encounter (HOSPITAL_BASED_OUTPATIENT_CLINIC_OR_DEPARTMENT_OTHER): Payer: Self-pay

## 2024-08-13 DIAGNOSIS — R109 Unspecified abdominal pain: Secondary | ICD-10-CM | POA: Diagnosis not present

## 2024-08-13 DIAGNOSIS — Z3A01 Less than 8 weeks gestation of pregnancy: Secondary | ICD-10-CM | POA: Insufficient documentation

## 2024-08-13 DIAGNOSIS — O26851 Spotting complicating pregnancy, first trimester: Secondary | ICD-10-CM | POA: Insufficient documentation

## 2024-08-13 DIAGNOSIS — Z91010 Allergy to peanuts: Secondary | ICD-10-CM | POA: Diagnosis not present

## 2024-08-13 NOTE — ED Triage Notes (Signed)
 Pt c/o L sided abd cramping, light pink spotting onset approx 3 days ago Just found out I'm pregnant, a lot of cramping- prone to ectopic pregnancies.  G2P0, LMP 12/7 approx

## 2024-08-14 ENCOUNTER — Emergency Department (HOSPITAL_BASED_OUTPATIENT_CLINIC_OR_DEPARTMENT_OTHER)
Admission: EM | Admit: 2024-08-14 | Discharge: 2024-08-14 | Disposition: A | Discharge status: Home / Self Care | Attending: Emergency Medicine | Admitting: Emergency Medicine

## 2024-08-14 DIAGNOSIS — O3680X0 Pregnancy with inconclusive fetal viability, not applicable or unspecified: Secondary | ICD-10-CM

## 2024-08-14 LAB — COMPREHENSIVE METABOLIC PANEL WITH GFR
ALT: 10 U/L (ref 0–44)
AST: 13 U/L — ABNORMAL LOW (ref 15–41)
Albumin: 4.4 g/dL (ref 3.5–5.0)
Alkaline Phosphatase: 76 U/L (ref 38–126)
Anion gap: 12 (ref 5–15)
BUN: 9 mg/dL (ref 6–20)
CO2: 22 mmol/L (ref 22–32)
Calcium: 10.2 mg/dL (ref 8.9–10.3)
Chloride: 103 mmol/L (ref 98–111)
Creatinine, Ser: 0.74 mg/dL (ref 0.44–1.00)
GFR, Estimated: >60 mL/min
Glucose, Bld: 98 mg/dL (ref 70–99)
Potassium: 4.3 mmol/L (ref 3.5–5.1)
Sodium: 137 mmol/L (ref 135–145)
Total Bilirubin: 0.3 mg/dL (ref 0.0–1.2)
Total Protein: 8.1 g/dL (ref 6.5–8.1)

## 2024-08-14 LAB — HCG, QUANTITATIVE, PREGNANCY: hCG, Beta Chain, Quant, S: 70 m[IU]/mL — ABNORMAL HIGH

## 2024-08-14 LAB — CBC WITH DIFFERENTIAL/PLATELET
Abs Immature Granulocytes: 0.04 K/uL (ref 0.00–0.07)
Basophils Absolute: 0.1 K/uL (ref 0.0–0.1)
Basophils Relative: 1 %
Eosinophils Absolute: 0.1 K/uL (ref 0.0–0.5)
Eosinophils Relative: 1 %
HCT: 34.8 % — ABNORMAL LOW (ref 36.0–46.0)
Hemoglobin: 12.3 g/dL (ref 12.0–15.0)
Immature Granulocytes: 0 %
Lymphocytes Relative: 22 %
Lymphs Abs: 2.5 K/uL (ref 0.7–4.0)
MCH: 24.6 pg — ABNORMAL LOW (ref 26.0–34.0)
MCHC: 35.3 g/dL (ref 30.0–36.0)
MCV: 69.5 fL — ABNORMAL LOW (ref 80.0–100.0)
Monocytes Absolute: 0.6 K/uL (ref 0.1–1.0)
Monocytes Relative: 5 %
Neutro Abs: 8.2 K/uL — ABNORMAL HIGH (ref 1.7–7.7)
Neutrophils Relative %: 71 %
Platelets: 422 K/uL — ABNORMAL HIGH (ref 150–400)
RBC: 5.01 MIL/uL (ref 3.87–5.11)
RDW: 16.9 % — ABNORMAL HIGH (ref 11.5–15.5)
WBC: 11.5 K/uL — ABNORMAL HIGH (ref 4.0–10.5)
nRBC: 0 % (ref 0.0–0.2)

## 2024-08-14 LAB — PREGNANCY, URINE: Preg Test, Ur: POSITIVE — AB

## 2024-08-14 NOTE — ED Provider Notes (Signed)
 " [place] EMERGENCY DEPARTMENT AT [facility] Provider Note   CSN: [id number] Arrival date & time: 08/13/24  2325     Patient presents with: No chief complaint on file.   Neomi Laidler is a 24 y.o. female.   Patient with a history of ectopic pregnancy.  Last menstrual cycle was in December.  She started having some abdominal cramping and spotting and had a positive pregnancy test so presents ER for further evaluation.  No significant vaginal bleeding.  No pain at this time.  No lightheadedness, fever, nausea or vomiting.  No other associated symptoms.        Prior to Admission medications  Medication Sig Start Date End Date Taking? Authorizing Provider  ARIPiprazole  (ABILIFY ) 2 MG tablet Take 1 tablet (2 mg total) by mouth daily. 06/25/23   Mozingo, Regina Nattalie, NP  hydrOXYzine (VISTARIL) 25 MG capsule Take 25 mg by mouth at bedtime. 05/22/23   [provider]  norelgestromin-ethinyl estradiol (XULANE) 150-35 MCG/24HR transdermal patch Place onto the skin. 06/20/23   [provider]  traZODone  (DESYREL ) 50 MG tablet Take 1 tablet (50 mg total) by mouth at bedtime. 06/25/23   Mozingo, Regina Nattalie, NP    Allergies: Peanut butter flavor [flavoring agent (non-screening)] and Salmon [fish allergy]    Review of Systems  Updated Vital Signs BP (!) 112/51 (BP Location: Right Arm)   Pulse 77   Temp 98.5 F (36.9 C) (Axillary)   Resp 17   LMP 07/13/2024 (Approximate)   SpO2 100%   Physical Exam Vitals and nursing note reviewed.  Constitutional:      Appearance: She is well-developed.  HENT:     Head: Normocephalic and atraumatic.  Cardiovascular:     Rate and Rhythm: Normal rate and regular rhythm.  Pulmonary:     Effort: No respiratory distress.     Breath sounds: No stridor.  Abdominal:     General: There is no distension.  Musculoskeletal:     Cervical back: Normal range of motion.  Neurological:     Mental Status: She is alert.      (all labs ordered are listed, but only abnormal results are displayed) Labs Reviewed  PREGNANCY, URINE - Abnormal; Notable for the following components:      Result Value   Preg Test, Ur POSITIVE (*)    All other components within normal limits  HCG, QUANTITATIVE, PREGNANCY - Abnormal; Notable for the following components:   hCG, Beta Chain, Quant, S 70 (*)    All other components within normal limits  CBC WITH DIFFERENTIAL/PLATELET - Abnormal; Notable for the following components:   WBC 11.5 (*)    HCT 34.8 (*)    MCV 69.5 (*)    MCH 24.6 (*)    RDW 16.9 (*)    Platelets 422 (*)    Neutro Abs 8.2 (*)    All other components within normal limits  COMPREHENSIVE METABOLIC PANEL WITH GFR - Abnormal; Notable for the following components:   AST 13 (*)    All other components within normal limits    EKG: None  Radiology: US  OB LESS THAN 14 WEEKS WITH OB TRANSVAGINAL Result Date: 08/14/2024 EXAM: OBSTETRIC ULTRASOUND FIRST TRIMESTER TECHNIQUE: Transvaginal first trimester obstetric pelvic duplex ultrasound was performed with real-time imaging, color flow Doppler imaging, and spectral analysis. COMPARISON: None available. CLINICAL HISTORY: [id number] Abdominal pain affecting pregnancy [id number] Abdominal pain affecting pregnancy. FINDINGS: UTERUS: 1.1 cm fibroid in the fundus. Endometrium 1.4 cm in thickness. GESTATIONAL SAC(S):  No intrauterine gestational sac. YOLK SAC: No yolk sac. EMBRYO(<11WK) /FETUS(>=11WK): No embryo. CROWN RUMP LENGTH: Not measured RATE OF CARDIAC ACTIVITY: Not measured. RIGHT OVARY: Unremarkable. Normal arterial and venous flow. LEFT OVARY: 3 cm left ovarian corpus luteal cyst. Normal arterial and venous flow. FREE FLUID: Trace free fluid. MEASUREMENTS ESTIMATED GESTATIONAL AGE BY CURRENT ULTRASOUND: Not applicable (no intrauterine pregnancy). ESTIMATED GESTATIONAL AGE BY LMP/PRIOR ULTRASOUND: Not provided. ESTIMATED DUE DATE: Not applicable. IMPRESSION: 1. Pregnancy of  unknown location. 2. Differential diagnosis includes nonvisualized early intrauterine pregnancy, nonvisualized ectopic pregnancy, or an early pregnancy loss that has completely passed. 3. Recommend follow-up with beta hCG and ultrasound as indicated. Electronically signed by: Franky Crease MD MD 08/14/2024 12:56 AM EST RP Workstation: [id number]     Procedures   Medications Ordered in the ED - No data to display                                  Medical Decision Making Amount and/or Complexity of Data Reviewed Labs: ordered. Radiology: ordered.   Ultrasound and hCG without a pregnancy of unknown location.  Is difficult to tell as she is very early in her pregnancy if she is still pregnant.  Very well could be a miscarriage or another ectopic.  Is too early to tell at this point.  She denies any significant pain or hypotension or anemia I did make me concerned for a ruptured ectopic.  Will have her follow-up with her obstetrician for repeat hCG and imaging as needed.  Final diagnoses:  Pregnancy of unknown anatomic location    ED Discharge Orders     None          Nakai Pollio, Selinda, MD 08/14/24 [phone]  "

## 2024-08-22 NOTE — Telephone Encounter (Signed)
 Patient seen at the ER with vaginal bleeding and pregnancy of unknown location. Patient with previous right ectopic and currently on imaging no pregnancy seen. Plan to check HCG on 1/19. Also will repeat an US  in 1 week to monitor left adnexal changes for possible ectopic.

## 2024-08-28 ENCOUNTER — Other Ambulatory Visit: Payer: Self-pay

## 2024-08-28 ENCOUNTER — Inpatient Hospital Stay (HOSPITAL_COMMUNITY)
Admission: AD | Admit: 2024-08-28 | Discharge: 2024-08-28 | Disposition: A | Discharge status: Home / Self Care | Attending: Family Medicine | Admitting: Family Medicine

## 2024-08-28 ENCOUNTER — Inpatient Hospital Stay (HOSPITAL_COMMUNITY)

## 2024-08-28 ENCOUNTER — Encounter (HOSPITAL_COMMUNITY): Payer: Self-pay | Admitting: Obstetrics and Gynecology

## 2024-08-28 DIAGNOSIS — Z3A01 Less than 8 weeks gestation of pregnancy: Secondary | ICD-10-CM

## 2024-08-28 DIAGNOSIS — O008 Other ectopic pregnancy without intrauterine pregnancy: Secondary | ICD-10-CM | POA: Diagnosis not present

## 2024-08-28 DIAGNOSIS — Z113 Encounter for screening for infections with a predominantly sexual mode of transmission: Secondary | ICD-10-CM | POA: Diagnosis present

## 2024-08-28 DIAGNOSIS — O00102 Left tubal pregnancy without intrauterine pregnancy: Secondary | ICD-10-CM

## 2024-08-28 DIAGNOSIS — O209 Hemorrhage in early pregnancy, unspecified: Secondary | ICD-10-CM | POA: Diagnosis present

## 2024-08-28 DIAGNOSIS — B9689 Other specified bacterial agents as the cause of diseases classified elsewhere: Secondary | ICD-10-CM | POA: Diagnosis not present

## 2024-08-28 DIAGNOSIS — F3160 Bipolar disorder, current episode mixed, unspecified: Secondary | ICD-10-CM

## 2024-08-28 DIAGNOSIS — R1032 Left lower quadrant pain: Secondary | ICD-10-CM | POA: Diagnosis present

## 2024-08-28 DIAGNOSIS — N3001 Acute cystitis with hematuria: Secondary | ICD-10-CM

## 2024-08-28 LAB — ABO/RH: ABO/RH(D): A POS

## 2024-08-28 LAB — CBC
HCT: 32.1 % — ABNORMAL LOW (ref 36.0–46.0)
Hemoglobin: 11.3 g/dL — ABNORMAL LOW (ref 12.0–15.0)
MCH: 24.8 pg — ABNORMAL LOW (ref 26.0–34.0)
MCHC: 35.2 g/dL (ref 30.0–36.0)
MCV: 70.5 fL — ABNORMAL LOW (ref 80.0–100.0)
Platelets: 405 K/uL — ABNORMAL HIGH (ref 150–400)
RBC: 4.55 MIL/uL (ref 3.87–5.11)
RDW: 17.2 % — ABNORMAL HIGH (ref 11.5–15.5)
WBC: 13.9 K/uL — ABNORMAL HIGH (ref 4.0–10.5)
nRBC: 0 % (ref 0.0–0.2)

## 2024-08-28 LAB — HCG, QUANTITATIVE, PREGNANCY: hCG, Beta Chain, Quant, S: 9846 m[IU]/mL — ABNORMAL HIGH

## 2024-08-28 LAB — GC/CHLAMYDIA PROBE AMP (~~LOC~~) NOT AT ARMC
Chlamydia: POSITIVE — AB
Comment: NEGATIVE
Comment: NORMAL
Neisseria Gonorrhea: NEGATIVE

## 2024-08-28 LAB — COMPREHENSIVE METABOLIC PANEL WITH GFR
ALT: 9 U/L (ref 0–44)
AST: 13 U/L — ABNORMAL LOW (ref 15–41)
Albumin: 3.9 g/dL (ref 3.5–5.0)
Alkaline Phosphatase: 73 U/L (ref 38–126)
Anion gap: 11 (ref 5–15)
BUN: 7 mg/dL (ref 6–20)
CO2: 20 mmol/L — ABNORMAL LOW (ref 22–32)
Calcium: 9.3 mg/dL (ref 8.9–10.3)
Chloride: 104 mmol/L (ref 98–111)
Creatinine, Ser: 0.7 mg/dL (ref 0.44–1.00)
GFR, Estimated: >60 mL/min
Glucose, Bld: 89 mg/dL (ref 70–99)
Potassium: 3.6 mmol/L (ref 3.5–5.1)
Sodium: 136 mmol/L (ref 135–145)
Total Bilirubin: 0.4 mg/dL (ref 0.0–1.2)
Total Protein: 6.9 g/dL (ref 6.5–8.1)

## 2024-08-28 LAB — URINALYSIS, ROUTINE W REFLEX MICROSCOPIC
Bilirubin Urine: NEGATIVE
Glucose, UA: NEGATIVE mg/dL
Ketones, ur: 20 mg/dL — AB
Nitrite: POSITIVE — AB
Protein, ur: 30 mg/dL — AB
Specific Gravity, Urine: 1.026 (ref 1.005–1.030)
pH: 5 (ref 5.0–8.0)

## 2024-08-28 LAB — WET PREP, GENITAL
Sperm: NONE SEEN
Trich, Wet Prep: NONE SEEN
WBC, Wet Prep HPF POC: >=10 — AB
Yeast Wet Prep HPF POC: NONE SEEN

## 2024-08-28 MED ORDER — METRONIDAZOLE 500 MG PO TABS: 500.0000 mg | ORAL_TABLET | Freq: Two times a day (BID) | ORAL | 0 refills | Status: DC

## 2024-08-28 MED ORDER — METHOTREXATE FOR ECTOPIC PREGNANCY
50.0000 mg/m2 | Freq: Once | INTRAMUSCULAR | Status: AC
  Administered 2024-08-28: 100 mg via INTRAMUSCULAR
  Filled 2024-08-28: qty 4

## 2024-08-28 MED ORDER — CEFADROXIL 500 MG PO CAPS: 500.0000 mg | ORAL_CAPSULE | Freq: Two times a day (BID) | ORAL | 0 refills | Status: DC

## 2024-08-28 NOTE — Telephone Encounter (Signed)
 Patient scheduled for confirmation visit today and canceled via mychart notice. Patient seen at the ER this morning and reports say that she is leaving and has follow up at 10 am. Patient with vaginal bleeding and abdominal pain with concerns for an ectopic pregnancy. I called patient to clarify if she is truly coming today given the concerns for an ectopic pregnancy. No answer to the phone call and unable to leave a message since voice mail is not set up.

## 2024-08-28 NOTE — Discharge Instructions (Signed)
 It was a pleasure taking care of you today.  I am sorry you are having these issues.  It looks like you have bacterial vaginosis which will be treated with metronidazole  you will take it twice daily for 7 days.  It also looks like you have a urinary tract infection which means you will need to take another antibiotic for 7 days as well.  Regarding your abdominal pain and bleeding it looks like you have an ectopic pregnancy and it is not clearly ruptured.  We discussed the options of surgery versus medication treatment and you elected for medication treatment.  You were given methotrexate .  Below is information on methotrexate .  The risks of methotrexate  were reviewed including failure requiring repeat dosing or eventual surgery. You expressed understanding that methotrexate  involves frequent return visits to monitor lab values and that you remain at risk of ectopic rupture until your pregnancy hormone level returns to normal. You have opted to proceed with methotrexate .  We discussed side effects of photosensitivity & GI upset.  You should avoid direct sunlight and abstain from alcohol, NSAIDs and sexual intercourse for two weeks. If you are taking any multivitamins with folic acid you should discontinue them.  ?You should follow up on day 4 (Sunday) and day 7 (Wednesday) for repeat pregnancy hormone level checks at MAU.  ?Call or return to the MAU with any abdominal pain, vomiting, fainting, or fever.

## 2024-08-28 NOTE — ED Provider Notes (Signed)
 "                                                                                   Emergency Department Provider Note    ED Clinical Impression   Final diagnoses:  Vaginal bleeding in pregnancy, first trimester (HHS-HCC) (Primary)    ED Assessment/Plan    Condition: Stable Disposition: Left Against Medical Advice  This chart has been completed using Dragon Medical Dictation software, and while attempts have been made to ensure accuracy, certain words and phrases may not be transcribed as intended.   History   Chief Complaint  Patient presents with   Vaginal Bleeding - Pregnant    Vaginal Bleeding - Pregnant Associated symptoms: no vaginal discharge     Angela Lara is a 24 y.o. female  who presents today to the  emergency department .  She is a G2 P0 010 who reports being approximately [redacted] weeks gestation.  She reports that she has been experiencing daily vaginal bleeding for several weeks now.  She was seen at this facility 1 week ago and another ER prior to that for vaginal bleeding.  She states that last week ultrasound was unable to locate an intrauterine pregnancy.  Chart review demonstrates that patient was also seen by OB during that same visit 7 days ago.  Patient reports that she has a follow-up visit with OB this morning at 10 AM.  She reports coming in today secondary to noticing increase vaginal bleeding compared to the previous couple weeks.  She also reports some left-sided abdominal pain more severe than her previous pain.  She denies any trauma.      Allergies: is allergic to fish containing products. Medications: is not on any long-term medications. PMHx:  has a past medical history of Bipolar disorder (CMS-HCC). PSHx:  has a past surgical history that includes Ectopic pregnancy surgery (Right, 2022). SocHx:  reports that she has quit smoking. Her smoking use included cigarettes and e-cigarettes. She has quit using smokeless tobacco. She reports that she  does not currently use drugs after having used the following drugs: Marijuana. She reports that she does not drink alcohol. Allergies, Medications, Medical, Surgical, and Social History were reviewed as documented above.   Social Drivers of Health with Concerns   Food Insecurity: Not on file  Tobacco Use: Medium Risk (08/28/2024)   Patient History    Smoking Tobacco Use: Former    Smokeless Tobacco Use: Former    Passive Exposure: Not on Chartered Certified Accountant Needs: Not on file  Alcohol Use: Not on file  Housing: Not on file  Physical Activity: Not on file  Utilities: Not on file  Stress: Not on file  Interpersonal Safety: Not on file  Substance Use: Not on file (08/18/2024)  Intimate Partner Violence: Not on file  Social Connections: Not on file  Financial Resource Strain: Not on file  Health Literacy: Not on file  Internet Connectivity: Not on file     Review Of Systems  Review of Systems  Constitutional: Negative.   HENT: Negative.    Eyes: Negative.   Respiratory: Negative.    Cardiovascular: Negative.   Gastrointestinal: Negative.  Endocrine: Negative.   Genitourinary:  Positive for pelvic pain and vaginal bleeding. Negative for vaginal discharge.  Musculoskeletal: Negative.   Skin: Negative.   Allergic/Immunologic: Negative.   Neurological: Negative.   Hematological: Negative.   Psychiatric/Behavioral: Negative.    All other systems reviewed and are negative.   Physical Exam   BP 128/73   Pulse 97   Temp 36.7 C (98.1 F) (Oral)   Resp 19   Wt 96.8 kg (213 lb 8 oz)   LMP 07/14/2024 (Exact Date)   SpO2 100%   BMI 43.12 kg/m   Physical Exam Vitals and nursing note reviewed.  Constitutional:      General: She is not in acute distress.    Appearance: Normal appearance. She is well-developed. She is not ill-appearing, toxic-appearing or diaphoretic.  HENT:     Head: Normocephalic and atraumatic.     Right Ear: External ear normal.     Left Ear:  External ear normal.     Mouth/Throat:     Mouth: Mucous membranes are moist.     Pharynx: Oropharynx is clear.  Eyes:     General: No visual field deficit or scleral icterus.    Conjunctiva/sclera: Conjunctivae normal.     Pupils: Pupils are equal.  Cardiovascular:     Rate and Rhythm: Normal rate and regular rhythm.     Pulses: Normal pulses.     Heart sounds: Normal heart sounds.  Pulmonary:     Effort: Pulmonary effort is normal.     Breath sounds: Normal breath sounds.  Abdominal:     General: Bowel sounds are normal. There is no distension.     Palpations: Abdomen is soft. There is no mass.     Tenderness: There is no abdominal tenderness.  Musculoskeletal:        General: Normal range of motion.     Cervical back: Normal range of motion and neck supple.  Skin:    General: Skin is warm and dry.  Neurological:     General: No focal deficit present.     Mental Status: She is alert and oriented to person, place, and time.     Cranial Nerves: No dysarthria or facial asymmetry.     Motor: No weakness.     Coordination: Coordination normal.     Gait: Gait normal.  Psychiatric:        Mood and Affect: Mood normal.        Speech: Speech normal.        Behavior: Behavior normal.        Thought Content: Thought content normal.        Judgment: Judgment normal.     ED Course  Medical Decision Making Differential diagnose includes: Miscarriage, ectopic pregnancy, vaginal bleeding in first trimester, subchorionic hemorrhage,  Angela Lara is a 24 y.o. female  who presents today to the  emergency department .  She is a G2 P0 010 who reports being approximately [redacted] weeks gestation.  She reports that she has been experiencing daily vaginal bleeding for several weeks now.   -CBC demonstrates a mild leukocytosis of 13.  Hemoglobin hematocrit unchanged from 7 days ago.  hCG quantitative pending  - Informed by nursing the patient is asked to sign out AMA.  She [facility]  Amount and/or Complexity of Data Reviewed Labs: ordered. Decision-making details documented in ED Course.     Procedures   No results  found for this visit on 08/28/24 (from the past 4464 hours).   ED Results Results for orders placed or performed during the hospital encounter of 08/28/24  PT-INR  Result Value Ref Range   PT 13.3 (H) 9.9 - 12.6 sec   INR 1.20 Undefined  PTT  Result Value Ref Range   APTT 27.5 24.8 - 38.4 sec  CBC w/ Differential  Result Value Ref Range   WBC 13.8 (H) 4.0 - 10.5 10*9/L   RBC 4.59 3.80 - 5.10 10*12/L   HGB 11.3 (L) 11.5 - 15.0 g/dL   HCT 67.9 (L) 65.9 - 55.9 %   MCV 69.7 (L) 80.0 - 98.0 fL   MCH 24.6 (L) 27.0 - 34.0 pg   MCHC 35.3 32.0 - 36.0 g/dL   RDW 82.6 (H) 88.4 - 85.4 %   MPV 9.7 7.4 - 10.4 fL   Platelet 382 140 - 415 10*9/L   Neutrophils % 67.2 %   Lymphocytes % 24.3 %   Monocytes % 6.8 %   Eosinophils % 0.8 %   Basophils % 0.5 %   Absolute Neutrophils 9.2 (H) 1.8 - 7.8 10*9/L   Absolute Lymphocytes 3.3 0.7 - 4.5 10*9/L   Absolute Monocytes 0.9 0.1 - 1.0 10*9/L   Absolute Eosinophils 0.1 0.0 - 0.4 10*9/L   Absolute Basophils 0.1 0.0 - 0.2 10*9/L   No results found.  Medications Administered: Medications - No data to display  Discharge Medications (Medications Prescribed during this  ED visit and Patient's Home Medications) :    Your Medication List    You have not been prescribed any medications.       Merilee Lani Sharper, MD 08/28/24 0559  "

## 2024-08-28 NOTE — ED Notes (Signed)
"   Patient has appointment this am with the [facility]. Patient says she just wanted to leave and is going over to the [facility].  "

## 2024-08-28 NOTE — MAU Provider Note (Addendum)
 " None     S [name] is a 24 y.o. G2P0010 pregnant female at [redacted]w[redacted]d who presents to MAU today with complaint of has had vaginal spotting for the past 6 days. She went to hospital last week - no IUP identified, concerned for ectopic. This morning at 0300 she was up to have BM, pain intensified, and then started bleeding heavily. Initially seen at [facility], was told they didn't have appropriate ultrasound resources.   Receives care at [facility]. Prenatal records reviewed.  Pertinent items noted in HPI and remainder of comprehensive ROS otherwise negative.   O BP 123/61 (BP Location: Left Arm)   Pulse 91   Temp 98.6 F (37 C) (Oral)   Resp 18   Ht 4' 11 (1.499 m)   Wt 96.2 kg   LMP 07/14/2024   SpO2 100%   BMI 42.84 kg/m  Physical Exam Vitals reviewed. Exam conducted with a chaperone present.  Constitutional:      General: She is not in acute distress.    Appearance: Normal appearance. She is well-developed. She is not ill-appearing, toxic-appearing or diaphoretic.  HENT:     Head: Normocephalic.  Cardiovascular:     Rate and Rhythm: Normal rate and regular rhythm.     Pulses: Normal pulses.     Heart sounds: Normal heart sounds.  Pulmonary:     Effort: Pulmonary effort is normal.     Breath sounds: Normal breath sounds.  Genitourinary:    General: Normal vulva.     Exam position: Lithotomy position.     Vagina: Bleeding present.     Cervix: Normal.     Comments: Speculum used to observe small amount of bright red vaginal bleeding in the vault. Cervix appears long and closed.  Skin:    General: Skin is warm and dry.     Capillary Refill: Capillary refill takes less than 2 seconds.  Neurological:     General: No focal deficit present.     Mental Status: She is alert and oriented to person, place, and time.      MDM: Moderate Evaluate bleeding in early pregnancy: concern for IUP vs. AB vs. Ectopic.   [place] workup with CMP, wet prep and GC added.    MAU Course:  A Vaginal bleeding affecting early pregnancy  Bacterial vaginosis  Medical screening exam complete  P  Follow up at [facility] as scheduled for ongoing prenatal care Sign out to Dr. Sloka Volante at [phone].  Camie Rote, MSN, CNM 08/28/2024 8:11 AM  Certified Nurse Midwife, [facility]  Received call from radiology regarding imaging.  Concern for left ectopic pregnancy.  Radiologist reports cystic structure superior to left ovary appears to contain yolk sac concerning for ectopic pregnancy.  Reports that there is some contained fluid around which may represent rupture but also may not.  Mild to moderate amount of free fluid in the abdomen.  Physical exam not indicative of acute abdomen but there is some tenderness.  Discussed options extensively with the patient regarding surgical management given we are unsure if it is ruptured or not versus methotrexate .  She has had prior ectopic pregnancy and lost her right tube.  Discussed that ruptured ectopic pregnancies can be deadly and that if surgery were performed she would lose that left tube.  Patient understanding and understands the risks if she just uses the medication for treatment.  She elects for methotrexate  use.  Patient given methotrexate  and given instructions on methotrexate .  Discussed strict MAU precautions and ectopic precautions with the patient and she will return immediately if she has any of the symptoms discussed.  Of note she also had clue cells and WBCs on her wet prep consistent with BV infection and has been having burning with urination and had nitrites in her urine consistent with UTI.  Prescription sent for metronidazole  and Duricef to patient's pharmacy.  Patient discharged home with no further questions or concerns and will return on Sunday for repeat hCG check.  Steffan Rover, MD Attending Family Medicine Physician, [facility] for [facility], [facility]    "

## 2024-08-28 NOTE — MAU Note (Signed)
 Emonii Wienke is a 24 y.o. at Unknown here in MAU reporting: [redacted] weeks pregnant - has had spotting for 6 days. Went to hospital last week - was told possible ectopic vs too early to see IUP. At 0300 had BM - then started having sharp cramps that started on left side of lower abdomen and shot up towards rib cage. Also started having heavy VB. Saturated regular size pad within 15 minutes. Called EMS - was taken to [facility] but was told they didn't have the resources to help her so she came here.   LMP: 07/14/24 Onset of complaint: 0300 Pain score: 8 Vitals:   08/28/24 0652  BP: 127/71  Pulse: (!) 101  Resp: 18  Temp: 98.6 F (37 C)  SpO2: 100%     FHT: NA  Lab orders placed from triage: UA

## 2024-08-28 NOTE — ED Triage Notes (Addendum)
 Pt BIB EMS c/o increased vaginal bleeding (approximately 1 liner pad) that began 10 minutes PTA. Pt states she has been spotting x 6 days. Pt c/o left sided groin pain. Symptoms began after pt had a bowel movement. Pt states that she is currently 5 weeks OB.

## 2024-08-31 ENCOUNTER — Inpatient Hospital Stay (HOSPITAL_COMMUNITY)
Admission: AD | Admit: 2024-08-31 | Discharge: 2024-08-31 | Disposition: A | Discharge status: Home / Self Care | Attending: Obstetrics and Gynecology | Admitting: Obstetrics and Gynecology

## 2024-08-31 ENCOUNTER — Other Ambulatory Visit: Payer: Self-pay

## 2024-08-31 ENCOUNTER — Inpatient Hospital Stay (HOSPITAL_COMMUNITY): Admit: 2024-08-31 | Discharge: 2024-08-31 | Disposition: A | Attending: Obstetrics and Gynecology

## 2024-08-31 DIAGNOSIS — Z3A01 Less than 8 weeks gestation of pregnancy: Secondary | ICD-10-CM

## 2024-08-31 DIAGNOSIS — O009 Unspecified ectopic pregnancy without intrauterine pregnancy: Secondary | ICD-10-CM | POA: Diagnosis present

## 2024-08-31 DIAGNOSIS — O00102 Left tubal pregnancy without intrauterine pregnancy: Secondary | ICD-10-CM | POA: Diagnosis present

## 2024-08-31 LAB — CBC WITH DIFFERENTIAL/PLATELET
Basophils Absolute: 0.1 10*3/uL (ref 0.0–0.1)
Basophils Relative: 1 %
Eosinophils Absolute: 0 10*3/uL (ref 0.0–0.5)
Eosinophils Relative: 0 %
HCT: 33.1 % — ABNORMAL LOW (ref 36.0–46.0)
Hemoglobin: 11.5 g/dL — ABNORMAL LOW (ref 12.0–15.0)
Lymphocytes Relative: 41 %
Lymphs Abs: 3.9 10*3/uL (ref 0.7–4.0)
MCH: 25.1 pg — ABNORMAL LOW (ref 26.0–34.0)
MCHC: 34.7 g/dL (ref 30.0–36.0)
MCV: 72.1 fL — ABNORMAL LOW (ref 80.0–100.0)
Monocytes Absolute: 0.1 10*3/uL (ref 0.1–1.0)
Monocytes Relative: 1 %
Neutro Abs: 5.5 10*3/uL (ref 1.7–7.7)
Neutrophils Relative %: 57 %
Platelets: 390 10*3/uL (ref 150–400)
RBC: 4.59 MIL/uL (ref 3.87–5.11)
RDW: 17.6 % — ABNORMAL HIGH (ref 11.5–15.5)
Smear Review: NORMAL
WBC: 9.6 10*3/uL (ref 4.0–10.5)
nRBC: 0 % (ref 0.0–0.2)

## 2024-08-31 LAB — COMPREHENSIVE METABOLIC PANEL WITH GFR
ALT: 12 U/L (ref 0–44)
AST: 20 U/L (ref 15–41)
Albumin: 3.9 g/dL (ref 3.5–5.0)
Alkaline Phosphatase: 53 U/L (ref 38–126)
Anion gap: 13 (ref 5–15)
BUN: 7 mg/dL (ref 6–20)
CO2: 21 mmol/L — ABNORMAL LOW (ref 22–32)
Calcium: 9.5 mg/dL (ref 8.9–10.3)
Chloride: 104 mmol/L (ref 98–111)
Creatinine, Ser: 0.84 mg/dL (ref 0.44–1.00)
GFR, Estimated: >60 mL/min
Glucose, Bld: 91 mg/dL (ref 70–99)
Potassium: 4.5 mmol/L (ref 3.5–5.1)
Sodium: 138 mmol/L (ref 135–145)
Total Bilirubin: 0.4 mg/dL (ref 0.0–1.2)
Total Protein: 7 g/dL (ref 6.5–8.1)

## 2024-08-31 LAB — HCG, QUANTITATIVE, PREGNANCY: hCG, Beta Chain, Quant, S: 17060 m[IU]/mL — ABNORMAL HIGH

## 2024-08-31 MED ORDER — CYCLOBENZAPRINE HCL 10 MG PO TABS
10.0000 mg | ORAL_TABLET | Freq: Once | ORAL | Status: AC
  Administered 2024-08-31: 10 mg via ORAL
  Filled 2024-08-31: qty 1

## 2024-08-31 MED ORDER — METHOTREXATE FOR ECTOPIC PREGNANCY
50.0000 mg/m2 | Freq: Once | INTRAMUSCULAR | Status: AC
  Administered 2024-08-31: 100 mg via INTRAMUSCULAR
  Filled 2024-08-31: qty 4

## 2024-08-31 MED ORDER — ONDANSETRON 4 MG PO TBDP
8.0000 mg | ORAL_TABLET | Freq: Once | ORAL | Status: AC
  Administered 2024-08-31: 8 mg via ORAL
  Filled 2024-08-31: qty 2

## 2024-08-31 NOTE — Discharge Instructions (Signed)
 Avoid direct sunlight and abstain from alcohol, NSAIDs and sexual intercourse for two weeks. She was counseled to discontinue any MVI with folic acid.  ?She understands to follow up on D4 (1/28) and D7 (1/31) for repeat BHCG and was given the instruction sheet.  ?Strict ectopic precautions were reviewed, the patient knows to call with any abdominal pain, vomiting, fainting, or any concerns with her health.  Day 0/1 Day 4 Day 7  Sunday Wednesday Saturday  Monday Thursday Sunday  Tuesday Friday Monday  Wednesday Saturday Tuesday  Thursday Sunday Wednesday  Friday Monday Thursday  Saturday Tuesday Friday

## 2024-08-31 NOTE — MAU Note (Signed)
 Angela Lara is a 24 y.o. at [redacted]w[redacted]d here in MAU reporting: she's here for lab work to help determine if she needs another shot.  Denies current VB, endorses intermittent cramping.    LMP: 07/14/2024 Onset of complaint: ongoing Pain score: 3 Vitals:   08/31/24 1210  BP: 121/79  Pulse: 86  Resp: 18  Temp: 98.5 F (36.9 C)  SpO2: 100%     FHT: NA  Lab orders placed from triage: HCG

## 2024-08-31 NOTE — MAU Provider Note (Signed)
 Chief Complaint Lab work  HCG f/u s/p methotrexate   08/28/24     SUBJECTIVE/HPI [name] is a 24 y.o. G2P0010 patient who presents to MAU today with complaint of her here for repeat lab work status post methotrexate  08/28/2024.  She is 6 weeks 6 days with ectopic pregnancy.    Patient had opted for methotrexate  on 1/22 since she had previously had a right ectopic pregnancy and lost her tube and she highly desires pregnancy in the future.  She understands the risks of using medication for treatment and has elected methotrexate  although she does understand that surgery is always a possibility if the medication fails.  She denies any vaginal bleeding, leaking of fluid, endorses mild intermittent cramping.  Today would be her day 4 labs status post methotrexate .   ROS: Abdominal Pain: Intermittent cramping Vaginal bleeding: none now.   Passage of clots or tissue: no Dizziness: no   OBJECTIVE BP (!) 142/79 (BP Location: Right Arm)   Pulse 86   Temp 98.5 F (36.9 C)   Resp 19   Ht 4' 11 (1.499 m)   Wt 97 kg   LMP 07/14/2024   SpO2 100%   BMI 43.18 kg/m  Physical Exam Vitals and nursing note reviewed. Exam conducted with a chaperone present.  Constitutional:      General: She is not in acute distress.    Appearance: Normal appearance. She is obese. She is not ill-appearing.  HENT:     Head: Normocephalic.     Nose: Nose normal.     Mouth/Throat:     Mouth: Mucous membranes are moist.  Cardiovascular:     Rate and Rhythm: Normal rate.  Pulmonary:     Effort: Pulmonary effort is normal.  Abdominal:     General: There is no distension.     Palpations: Abdomen is soft.     Tenderness: There is no abdominal tenderness. There is no guarding or rebound.  Musculoskeletal:        General: Normal range of motion.     Cervical back: Normal range of motion.  Skin:    General: Skin is warm.  Neurological:     Mental Status: She is oriented to person, place, and time.   Psychiatric:        Mood and Affect: Mood normal.        Behavior: Behavior normal.        Thought Content: Thought content normal.        Judgment: Judgment normal.      MDM  MODERATE   Prenatal records reviewed OB ultrasound images reviewed with attending Dr. Izell from 1 22-26 hCG lab trend reviewed  hCG: Increased to 17,060 on D4   Decision to repeat methotrexate  dose on day 4 per OB attending Table regarding treatment for methotrexate  given to the patient again with strict instructions and she verbalized understanding  CBC and CMP for second dose of methotrexate  on day 4 per OB attending Methotrexate  ordered from pharmacy Orders Placed This Encounter  Procedures   hCG, quantitative, pregnancy    Standing Status:   Standing    Number of Occurrences:   1   Comprehensive metabolic panel with GFR    Standing Status:   Standing    Number of Occurrences:   1   CBC with Differential    Standing Status:   Standing    Number of Occurrences:   1   Notify physician (specify) Call MD with lab results prior to  MTX administration.    Call MD with lab results prior to MTX administration.    Standing Status:   Standing    Number of Occurrences:   1   Height and weight    Standing Status:   Standing    Number of Occurrences:   1   Discharge patient Discharge disposition: 01-Home or Self Care; Discharge patient date: 08/31/2024    Standing Status:   Standing    Number of Occurrences:   1    Discharge disposition:   01-Home or Self Care [1]    Discharge patient date:   08/31/2024   Discharge patient Discharge disposition: 01-Home or Self Care; Discharge patient date: 08/31/2024    Standing Status:   Standing    Number of Occurrences:   1    Discharge disposition:   01-Home or Self Care [1]    Discharge patient date:   08/31/2024      Results for orders placed or performed during the hospital encounter of 08/31/24 (from the past 24 hours)  hCG, quantitative, pregnancy      Status: Abnormal   Collection Time: 08/31/24 12:09 PM  Result Value Ref Range   hCG, Beta Chain, Quant, S 17,060 (H) <5 mIU/mL  Comprehensive metabolic panel with GFR     Status: Abnormal   Collection Time: 08/31/24 12:09 PM  Result Value Ref Range   Sodium 138 135 - 145 mmol/L   Potassium 4.5 3.5 - 5.1 mmol/L   Chloride 104 98 - 111 mmol/L   CO2 21 (L) 22 - 32 mmol/L   Glucose, Bld 91 70 - 99 mg/dL   BUN 7 6 - 20 mg/dL   Creatinine, Ser 9.15 0.44 - 1.00 mg/dL   Calcium 9.5 8.9 - 89.6 mg/dL   Total Protein 7.0 6.5 - 8.1 g/dL   Albumin 3.9 3.5 - 5.0 g/dL   AST 20 15 - 41 U/L   ALT 12 0 - 44 U/L   Alkaline Phosphatase 53 38 - 126 U/L   Total Bilirubin 0.4 0.0 - 1.2 mg/dL   GFR, Estimated >39 >39 mL/min   Anion gap 13 5 - 15  CBC with Differential     Status: Abnormal   Collection Time: 08/31/24 12:09 PM  Result Value Ref Range   WBC 9.6 4.0 - 10.5 K/uL   RBC 4.59 3.87 - 5.11 MIL/uL   Hemoglobin 11.5 (L) 12.0 - 15.0 g/dL   HCT 66.8 (L) 63.9 - 53.9 %   MCV 72.1 (L) 80.0 - 100.0 fL   MCH 25.1 (L) 26.0 - 34.0 pg   MCHC 34.7 30.0 - 36.0 g/dL   RDW 82.3 (H) 88.4 - 84.4 %   Platelets 390 150 - 400 K/uL   nRBC 0.0 0.0 - 0.2 %   Neutrophils Relative % 57 %   Neutro Abs 5.5 1.7 - 7.7 K/uL   Lymphocytes Relative 41 %   Lymphs Abs 3.9 0.7 - 4.0 K/uL   Monocytes Relative 1 %   Monocytes Absolute 0.1 0.1 - 1.0 K/uL   Eosinophils Relative 0 %   Eosinophils Absolute 0.0 0.0 - 0.5 K/uL   Basophils Relative 1 %   Basophils Absolute 0.1 0.0 - 0.1 K/uL   WBC Morphology See Note    RBC Morphology MORPHOLOGY UNREMARKABLE    Smear Review Normal platelet morphology     Lab Results  Component Value Date   HCGBETAQNT 17,060 (H) 08/31/2024   HCGBETAQNT 9,846 (H) 08/28/2024   HCGBETAQNT 70 (  H) 08/13/2024   HCGBETAQNT 4,543 (H) 12/12/2021        ASSESSMENT/PLAN Medical screening exam complete  Left tubal pregnancy without intrauterine pregnancy Second dose of methotrexate   administered today Return on 09/03/2024 for repeat hCG level Day 4, day 7 hCG labs and table provided to patient Message sent to office for appointment  [redacted] weeks gestation of pregnancy Ectopic pregnancy status post methotrexate   I spoke with OB Attending due to high rise in HCG quant on D4 s/p methotrexate   Decision was to administer 2nd dose of methotrexate  today and restart protocol per Dr Elvera [facility] Attending)  Patient given the precautions and information sheet and table regarding appropriate f/u with strict precautions regarding ectopic pregnancy symtoms    The risks of methotrexate  were reviewed including failure requiring repeat dosing or eventual surgery. She understands that methotrexate  involves frequent return visits to monitor lab values and that she remains at risk of ectopic rupture until her beta is less than assay. ?The patient opts to proceed with methotrexate .  She has no history of hepatic or renal dysfunction, has normal BUN/Cr/LFT's/platelets.  She is felt to be reliable for follow-up. Side effects of photosensitivity & GI upset were discussed.  She knows to avoid direct sunlight and abstain from alcohol, NSAIDs and sexual intercourse for two weeks. She was counseled to discontinue any MVI with folic acid.   ?She understands to follow up on D4 (1/28) and D7 (1/31) for repeat BHCG and was given the instruction sheet. ?Strict ectopic precautions were reviewed, the patient knows to call with any abdominal pain, vomiting, fainting, or any concerns with her health.  Day 0/1 Day 4 Day 7  Sunday Wednesday Saturday  Monday Thursday Sunday  Tuesday Friday Monday  Wednesday Saturday Tuesday  Thursday Sunday Wednesday  Friday Monday Thursday  Saturday Tuesday Friday     Discharge from MAU in stable condition  [facility] HCG stat lab appoitment  See AVS for full description of educational information and instructions provided to the patient at time  of discharge  Warning signs for worsening condition that would warrant emergency follow-up discussed  Patient may return to MAU as needed   Littie Olam LABOR, NP 08/31/2024 4:38 PM

## 2024-09-01 ENCOUNTER — Telehealth (HOSPITAL_COMMUNITY): Payer: Self-pay

## 2024-09-01 NOTE — Telephone Encounter (Signed)
 Attempted to contact pt.  No answer, unable to leave a voice mail.  Results faxed to [facility]

## 2024-09-02 ENCOUNTER — Encounter (HOSPITAL_COMMUNITY): Payer: Self-pay | Admitting: Obstetrics & Gynecology

## 2024-09-02 ENCOUNTER — Inpatient Hospital Stay (HOSPITAL_COMMUNITY): Admitting: Anesthesiology

## 2024-09-02 ENCOUNTER — Encounter (HOSPITAL_COMMUNITY)
Admission: AD | Disposition: A | Discharge status: Home / Self Care | Payer: Self-pay | Source: Home / Self Care | Attending: Obstetrics & Gynecology

## 2024-09-02 ENCOUNTER — Inpatient Hospital Stay (HOSPITAL_COMMUNITY)
Admission: AD | Admit: 2024-09-02 | Discharge: 2024-09-02 | Disposition: A | Discharge status: Home / Self Care | Attending: Obstetrics & Gynecology | Admitting: Obstetrics & Gynecology

## 2024-09-02 ENCOUNTER — Other Ambulatory Visit: Payer: Self-pay

## 2024-09-02 ENCOUNTER — Inpatient Hospital Stay (HOSPITAL_COMMUNITY)

## 2024-09-02 DIAGNOSIS — O009 Unspecified ectopic pregnancy without intrauterine pregnancy: Secondary | ICD-10-CM

## 2024-09-02 DIAGNOSIS — Z3A01 Less than 8 weeks gestation of pregnancy: Secondary | ICD-10-CM

## 2024-09-02 DIAGNOSIS — O00102 Left tubal pregnancy without intrauterine pregnancy: Secondary | ICD-10-CM

## 2024-09-02 LAB — CBC
HCT: 33 % — ABNORMAL LOW (ref 36.0–46.0)
Hemoglobin: 11.7 g/dL — ABNORMAL LOW (ref 12.0–15.0)
MCH: 25.4 pg — ABNORMAL LOW (ref 26.0–34.0)
MCHC: 35.5 g/dL (ref 30.0–36.0)
MCV: 71.6 fL — ABNORMAL LOW (ref 80.0–100.0)
Platelets: 392 10*3/uL (ref 150–400)
RBC: 4.61 MIL/uL (ref 3.87–5.11)
RDW: 17.2 % — ABNORMAL HIGH (ref 11.5–15.5)
WBC: 8.1 10*3/uL (ref 4.0–10.5)
nRBC: 0 % (ref 0.0–0.2)

## 2024-09-02 LAB — URINALYSIS, ROUTINE W REFLEX MICROSCOPIC
Bilirubin Urine: NEGATIVE
Glucose, UA: NEGATIVE mg/dL
Ketones, ur: NEGATIVE mg/dL
Nitrite: NEGATIVE
Protein, ur: NEGATIVE mg/dL
Specific Gravity, Urine: 1.005 (ref 1.005–1.030)
pH: 6 (ref 5.0–8.0)

## 2024-09-02 LAB — HCG, QUANTITATIVE, PREGNANCY: hCG, Beta Chain, Quant, S: 18328 m[IU]/mL — ABNORMAL HIGH

## 2024-09-02 LAB — ABO/RH: ABO/RH(D): A POS

## 2024-09-02 MED ORDER — ONDANSETRON HCL 4 MG/2ML IJ SOLN: 4.0000 mg | Freq: Once | INTRAMUSCULAR | Status: DC | PRN

## 2024-09-02 MED ORDER — FENTANYL CITRATE (PF) 100 MCG/2ML IJ SOLN
INTRAMUSCULAR | Status: AC
  Filled 2024-09-02: qty 2

## 2024-09-02 MED ORDER — FENTANYL CITRATE (PF) 250 MCG/5ML IJ SOLN
INTRAMUSCULAR | Status: DC | PRN
  Administered 2024-09-02 (×2): 50 ug via INTRAVENOUS
  Administered 2024-09-02: 100 ug via INTRAVENOUS

## 2024-09-02 MED ORDER — OXYCODONE HCL 5 MG/5ML PO SOLN: 5.0000 mg | Freq: Once | ORAL | Status: AC | PRN

## 2024-09-02 MED ORDER — HEMOSTATIC AGENTS (NO CHARGE) OPTIME
TOPICAL | Status: DC | PRN
  Administered 2024-09-02: 1 via TOPICAL

## 2024-09-02 MED ORDER — OXYCODONE HCL 5 MG PO TABS
ORAL_TABLET | ORAL | Status: AC
  Filled 2024-09-02: qty 1

## 2024-09-02 MED ORDER — DEXAMETHASONE SOD PHOSPHATE PF 10 MG/ML IJ SOLN
INTRAMUSCULAR | Status: DC | PRN
  Administered 2024-09-02: 10 mg via INTRAVENOUS

## 2024-09-02 MED ORDER — SCOPOLAMINE 1 MG/3DAYS TD PT72
1.0000 | MEDICATED_PATCH | TRANSDERMAL | Status: DC
  Administered 2024-09-02: 1 mg via TRANSDERMAL
  Filled 2024-09-02: qty 1

## 2024-09-02 MED ORDER — ROCURONIUM BROMIDE 10 MG/ML (PF) SYRINGE
PREFILLED_SYRINGE | INTRAVENOUS | Status: DC | PRN
  Administered 2024-09-02: 70 mg via INTRAVENOUS
  Administered 2024-09-02: 20 mg via INTRAVENOUS

## 2024-09-02 MED ORDER — HYDROMORPHONE HCL 1 MG/ML IJ SOLN
INTRAMUSCULAR | Status: AC
  Filled 2024-09-02: qty 0.5

## 2024-09-02 MED ORDER — IBUPROFEN 600 MG PO TABS: 600.0000 mg | ORAL_TABLET | Freq: Four times a day (QID) | ORAL | 3 refills | Status: AC | PRN

## 2024-09-02 MED ORDER — ONDANSETRON HCL 4 MG/2ML IJ SOLN
INTRAMUSCULAR | Status: DC | PRN
  Administered 2024-09-02: 4 mg via INTRAVENOUS

## 2024-09-02 MED ORDER — VASOPRESSIN 20 UNIT/ML IV SOLN
INTRAVENOUS | Status: AC
  Filled 2024-09-02: qty 1

## 2024-09-02 MED ORDER — MEPERIDINE HCL 25 MG/ML IJ SOLN: 6.2500 mg | INTRAMUSCULAR | Status: DC | PRN

## 2024-09-02 MED ORDER — SODIUM CHLORIDE (PF) 0.9 % IJ SOLN
INTRAMUSCULAR | Status: AC
  Filled 2024-09-02: qty 100

## 2024-09-02 MED ORDER — PHENYLEPHRINE 80 MCG/ML (10ML) SYRINGE FOR IV PUSH (FOR BLOOD PRESSURE SUPPORT)
PREFILLED_SYRINGE | INTRAVENOUS | Status: DC | PRN
  Administered 2024-09-02: 160 ug via INTRAVENOUS
  Administered 2024-09-02 (×2): 80 ug via INTRAVENOUS

## 2024-09-02 MED ORDER — KETAMINE HCL 10 MG/ML IJ SOLN
INTRAMUSCULAR | Status: DC | PRN
  Administered 2024-09-02: 30 mg via INTRAVENOUS

## 2024-09-02 MED ORDER — BUPIVACAINE-EPINEPHRINE (PF) 0.25% -1:200000 IJ SOLN
INTRAMUSCULAR | Status: AC
  Filled 2024-09-02: qty 30

## 2024-09-02 MED ORDER — HYDROMORPHONE HCL 1 MG/ML IJ SOLN
INTRAMUSCULAR | Status: DC | PRN
  Administered 2024-09-02: .5 mg via INTRAVENOUS

## 2024-09-02 MED ORDER — VASOPRESSIN 20 UNIT/ML IV SOLN
INTRAVENOUS | Status: DC | PRN
  Administered 2024-09-02: 10 mL via INTRAMUSCULAR

## 2024-09-02 MED ORDER — KETAMINE HCL 50 MG/5ML IJ SOSY
PREFILLED_SYRINGE | INTRAMUSCULAR | Status: AC
  Filled 2024-09-02: qty 5

## 2024-09-02 MED ORDER — MIDAZOLAM HCL 2 MG/2ML IJ SOLN
INTRAMUSCULAR | Status: AC
  Filled 2024-09-02: qty 2

## 2024-09-02 MED ORDER — BUPIVACAINE HCL (PF) 0.25 % IJ SOLN
INTRAMUSCULAR | Status: AC
  Filled 2024-09-02: qty 30

## 2024-09-02 MED ORDER — AMISULPRIDE (ANTIEMETIC) 5 MG/2ML IV SOLN: 10.0000 mg | Freq: Once | INTRAVENOUS | Status: DC | PRN

## 2024-09-02 MED ORDER — LACTATED RINGERS IV SOLN: INTRAVENOUS | Status: DC | PRN

## 2024-09-02 MED ORDER — 0.9 % SODIUM CHLORIDE (POUR BTL) OPTIME
TOPICAL | Status: DC | PRN
  Administered 2024-09-02: 1000 mL

## 2024-09-02 MED ORDER — AZITHROMYCIN 500 MG PO TABS
1000.0000 mg | ORAL_TABLET | Freq: Once | ORAL | Status: AC
  Administered 2024-09-02: 1000 mg via ORAL
  Filled 2024-09-02: qty 2

## 2024-09-02 MED ORDER — OXYCODONE HCL 5 MG PO TABS
5.0000 mg | ORAL_TABLET | Freq: Once | ORAL | Status: AC | PRN
  Administered 2024-09-02: 5 mg via ORAL

## 2024-09-02 MED ORDER — LACTATED RINGERS IV SOLN: INTRAVENOUS | Status: DC

## 2024-09-02 MED ORDER — MIDAZOLAM HCL (PF) 2 MG/2ML IJ SOLN
INTRAMUSCULAR | Status: DC | PRN
  Administered 2024-09-02: 2 mg via INTRAVENOUS

## 2024-09-02 MED ORDER — KETOROLAC TROMETHAMINE 30 MG/ML IJ SOLN
INTRAMUSCULAR | Status: DC | PRN
  Administered 2024-09-02: 30 mg via INTRAVENOUS

## 2024-09-02 MED ORDER — SUGAMMADEX SODIUM 200 MG/2ML IV SOLN
INTRAVENOUS | Status: DC | PRN
  Administered 2024-09-02: 200 mg via INTRAVENOUS

## 2024-09-02 MED ORDER — HYDROMORPHONE HCL 1 MG/ML IJ SOLN: 0.2500 mg | INTRAMUSCULAR | Status: DC | PRN

## 2024-09-02 MED ORDER — DEXMEDETOMIDINE HCL IN NACL 80 MCG/20ML IV SOLN
INTRAVENOUS | Status: DC | PRN
  Administered 2024-09-02: 8 ug via INTRAVENOUS
  Administered 2024-09-02: 12 ug via INTRAVENOUS

## 2024-09-02 MED ORDER — LIDOCAINE 2% (20 MG/ML) 5 ML SYRINGE
INTRAMUSCULAR | Status: DC | PRN
  Administered 2024-09-02: 60 mg via INTRAVENOUS

## 2024-09-02 MED ORDER — ONDANSETRON HCL 4 MG/2ML IJ SOLN
4.0000 mg | Freq: Once | INTRAMUSCULAR | Status: AC
  Administered 2024-09-02: 4 mg via INTRAVENOUS
  Filled 2024-09-02: qty 2

## 2024-09-02 MED ORDER — CHLORHEXIDINE GLUCONATE 0.12 % MT SOLN
OROMUCOSAL | Status: AC
  Administered 2024-09-02: 15 mL via OROMUCOSAL
  Filled 2024-09-02: qty 15

## 2024-09-02 MED ORDER — SODIUM CHLORIDE 0.9 % IR SOLN
Status: DC | PRN
  Administered 2024-09-02: 1000 mL

## 2024-09-02 MED ORDER — BUPIVACAINE HCL (PF) 0.25 % IJ SOLN
INTRAMUSCULAR | Status: DC | PRN
  Administered 2024-09-02: 10 mL

## 2024-09-02 MED ORDER — ACETAMINOPHEN 500 MG PO TABS
1000.0000 mg | ORAL_TABLET | Freq: Once | ORAL | Status: AC
  Administered 2024-09-02: 1000 mg via ORAL
  Filled 2024-09-02: qty 2

## 2024-09-02 MED ORDER — PROPOFOL 10 MG/ML IV BOLUS
INTRAVENOUS | Status: DC | PRN
  Administered 2024-09-02: 200 mg via INTRAVENOUS

## 2024-09-02 MED ORDER — OXYCODONE HCL 5 MG PO TABS: 5.0000 mg | ORAL_TABLET | ORAL | 0 refills | Status: AC | PRN

## 2024-09-02 MED ORDER — KETOROLAC TROMETHAMINE 30 MG/ML IJ SOLN: 30.0000 mg | Freq: Once | INTRAMUSCULAR | Status: DC | PRN

## 2024-09-02 MED ORDER — ORAL CARE MOUTH RINSE: 15.0000 mL | Freq: Once | OROMUCOSAL | Status: AC

## 2024-09-02 MED ORDER — CHLORHEXIDINE GLUCONATE 0.12 % MT SOLN: 15.0000 mL | Freq: Once | OROMUCOSAL | Status: AC

## 2024-09-02 NOTE — MAU Note (Signed)
 MAU Triage Note: Jerzi Tigert is a 24 y.o. at [redacted]w[redacted]d here in MAU reporting: scheduled for repeat hCG on 09/03/24 after receiving methotrexate . Reports sharp, cramping pelvic pain that is intermittent. She also reports cramping in her lower back. Also reporting nausea that resolved with medication that she last took yesterday morning, has returned. Denies VB or abnormal discharge.   Patient complaint: pelvic pain from ectopic pregnancy shot  Pain Score: 5  Pain Location: Pelvis Pain Score: 5 Pain Location: Back   Onset of complaint: today LMP: Patient's last menstrual period was 07/14/2024.  Vitals:   09/02/24 0525  BP: 129/75  Pulse: 86  Resp: 18  Temp: 98.5 F (36.9 C)  SpO2: 100%    FHT:   N/A Lab orders placed from triage: UA

## 2024-09-02 NOTE — H&P (Signed)
 Angela Lara is an 24 y.o. female. G2P0010 Patient's last menstrual period was 07/14/2024. [redacted]w[redacted]d Patient has been followed in MAU since 08/28/2024 with ectopic pregnancy. As she had an ectopic in 12/2021 and her right tube was removed she elected to have methotrexate  that day. She reported to MAU today because of sharp abdominal pains. Methotrexate  was repeated on day 4 as her [facility] increased from 9846 to 17060. HCG is pending.  US  showed left ectopic fetal pole and FHM. This was explained to be a methotrexate  failure and she accepts surgical management  Pertinent Gynecological History:  Bleeding: light Contraception: none Blood transfusions: none Sexually transmitted diseases: currently at risk and positive chlamydia 1/22 needs tx Previous GYN Procedures: right L/S salpingectomy       Menstrual History:  Patient's last menstrual period was 07/14/2024.    Past Medical History:  Diagnosis Date   ADHD    Adjustment disorder    Deliberate self-cutting    Major depression    Suicide attempt (HCC) 05/06/2017   Wellbutrin    Past Surgical History:  Procedure Laterality Date   DIAGNOSTIC LAPAROSCOPY WITH REMOVAL OF ECTOPIC PREGNANCY N/A 12/12/2021   Procedure: DIAGNOSTIC LAPAROSCOPY WITH REMOVAL OF ECTOPIC PREGNANCY AND RIGHT SALPINGECTOMY;  Surgeon: Cleatus Moccasin, MD;  Location: [facility] OR;  Service: Gynecology;  Laterality: N/A;    Family History  Problem Relation Age of Onset   Depression Father    Drug abuse Father    ADD / ADHD Brother     Social History:  reports that she has never smoked. She has never used smokeless tobacco. She reports that she does not currently use alcohol. She reports current drug use. Frequency: 1.00 time per week. Drug: Marijuana.  Allergies: Allergies[1]  Medications Prior to Admission  Medication Sig Dispense Refill Last Dose/Taking   ARIPiprazole  (ABILIFY ) 2 MG tablet Take 1 tablet (2 mg total) by mouth daily. (Patient not taking: Reported on  08/28/2024) 30 tablet 2    cefadroxil  (DURICEF) 500 MG capsule Take 1 capsule (500 mg total) by mouth 2 (two) times daily. 14 capsule 0    hydrOXYzine (VISTARIL) 25 MG capsule Take 25 mg by mouth at bedtime. (Patient not taking: Reported on 08/28/2024)      metroNIDAZOLE  (FLAGYL ) 500 MG tablet Take 1 tablet (500 mg total) by mouth 2 (two) times daily. 14 tablet 0    traZODone  (DESYREL ) 50 MG tablet Take 1 tablet (50 mg total) by mouth at bedtime. (Patient not taking: Reported on 08/28/2024) 30 tablet 2     Review of Systems  Constitutional: Negative.   Respiratory: Negative.    Cardiovascular: Negative.   Gastrointestinal: Negative.   Genitourinary:  Positive for pelvic pain and vaginal bleeding.    Blood pressure 129/75, pulse 86, temperature 98.5 F (36.9 C), temperature source Oral, resp. rate 18, last menstrual period 07/14/2024, SpO2 100%, unknown if currently breastfeeding. Physical Exam Vitals and nursing note reviewed. Exam conducted with a chaperone present.  Constitutional:      Appearance: Normal appearance.  HENT:     Head: Normocephalic and atraumatic.  Cardiovascular:     Rate and Rhythm: Normal rate.  Pulmonary:     Effort: Pulmonary effort is normal.  Abdominal:     General: Abdomen is flat.     Palpations: Abdomen is soft.  Musculoskeletal:     Cervical back: Normal range of motion.  Skin:    General: Skin is warm and dry.  Neurological:     Mental Status: She is  alert.  Psychiatric:        Mood and Affect: Mood normal.        Behavior: Behavior normal.     Results for orders placed or performed during the hospital encounter of 09/02/24 (from the past 24 hours)  Urinalysis, Routine w reflex microscopic -Urine, Clean Catch     Status: Abnormal   Collection Time: 09/02/24  5:28 AM  Result Value Ref Range   Color, Urine YELLOW YELLOW   APPearance HAZY (A) CLEAR   Specific Gravity, Urine 1.005 1.005 - 1.030   pH 6.0 5.0 - 8.0   Glucose, UA NEGATIVE NEGATIVE  mg/dL   Hgb urine dipstick MODERATE (A) NEGATIVE   Bilirubin Urine NEGATIVE NEGATIVE   Ketones, ur NEGATIVE NEGATIVE mg/dL   Protein, ur NEGATIVE NEGATIVE mg/dL   Nitrite NEGATIVE NEGATIVE   Leukocytes,Ua SMALL (A) NEGATIVE   RBC / HPF 0-5 0 - 5 RBC/hpf   WBC, UA 0-5 0 - 5 WBC/hpf   Bacteria, UA RARE (A) NONE SEEN   Squamous Epithelial / HPF 6-10 0 - 5 /HPF   Hyaline Casts, UA PRESENT   CBC     Status: Abnormal   Collection Time: 09/02/24  5:37 AM  Result Value Ref Range   WBC 8.1 4.0 - 10.5 K/uL   RBC 4.61 3.87 - 5.11 MIL/uL   Hemoglobin 11.7 (L) 12.0 - 15.0 g/dL   HCT 66.9 (L) 63.9 - 53.9 %   MCV 71.6 (L) 80.0 - 100.0 fL   MCH 25.4 (L) 26.0 - 34.0 pg   MCHC 35.5 30.0 - 36.0 g/dL   RDW 82.7 (H) 88.4 - 84.4 %   Platelets 392 150 - 400 K/uL   nRBC 0.0 0.0 - 0.2 %  ABO/Rh     Status: None   Collection Time: 09/02/24  5:37 AM  Result Value Ref Range   ABO/RH(D) A POS    No rh immune globuloin      NOT A RH IMMUNE GLOBULIN CANDIDATE, PT RH POSITIVE Performed at [facility], [address] [place], [place] [address]     ADDENDUM #1  ADDENDUM: Study discussed by telephone with MAU provider Emilio at [phone] hours on 09/02/2024.   ----------------------------------------------------   Electronically signed by: Helayne Hurst MD 09/02/2024 06:28 AM EST RP Workstation: [id number]    Addended by Hurst Helayne, MD on 09/02/2024  6:28 AM    Study Result  Narrative & Impression   ADDENDUM #2  ADDENDUM: Study discussed by telephone with MAU provider Emilio at [phone] hours on 09/02/2024.   ----------------------------------------------------   Electronically signed by: Helayne Hurst MD 09/02/2024 06:28 AM EST RP Workstation: [id number] EXAM: OBSTETRIC ULTRASOUND FIRST TRIMESTER   TECHNIQUE: Transvaginal first trimester obstetric pelvic duplex ultrasound was performed with real-time imaging, color flow Doppler imaging, and spectral analysis.   COMPARISON: US  OB  Transvaginal 08/28/2024.   CLINICAL HISTORY: Patient is a 24 year old female. Absent intrauterine gestational sac and suspicious left adnexal finding on ultrasound 5 days ago. Quantitative beta human chorionic gonadotropin  (hCG) increased from 9846 at that time to 17060 on 08/31/2024.   FINDINGS:   UTERUS: Benjamine appearance of the uterine endometrium. No focal myometrial mass.   GESTATIONAL SAC(S): No intrauterine gestational sac.  See Left Ovarian abnormality.     RIGHT OVARY: Right ovary is normal measuring 3.7 x 2.4 x 2.2 cm. Estimated volume: 10 mL. Normal arterial and venous flow.   LEFT OVARY: Left ovarian associated gestational sac with both yolk sac  and fetal pole now identified. See series 1 images 21 through 30. Fetal cardiac activity detected there of 82 beats per minute. 6 weeks and 1 day (based on 5.0 mm crown rump length). Left ovary plus ectopic measures 5.2 x 3.7 x 3.4 cm. Estimated volume: 34 mL. Adjacent left ovary otherwise within normal limits. Normal arterial and venous flow.   FREE FLUID: No free fluid.     IMPRESSION: 1. Live ectopic pregnancy identified at the left adnexa.  No pelvis free fluid.   Electronically signed by: Helayne Hurst MD 09/02/2024 06:22 AM EST RP      Assessment/Plan: Left ectopic pregnancy with FHM noted, c/w failed medical therapy. Posted for laparoscopy and removal of ectopic pregnancy with possible salpingectomy.   The risks of surgery were discussed in detail with the patient including but not limited to: bleeding which may require transfusion or reoperation; infection which may require prolonged hospitalization or re-hospitalization and antibiotic therapy; injury to bowel, bladder, ureters and major vessels or other surrounding organs which may lead to other procedures; formation of adhesions; need for additional procedures including laparotomy or subsequent procedures secondary to intraoperative injury or abnormal pathology;  thromboembolic phenomenon; incisional problems and other postoperative or anesthesia complications.  Patient was told that the likelihood that her condition and symptoms will be treated effectively with this surgical management was very high; the postoperative expectations were also discussed in detail. The patient also understands the alternative treatment options which were discussed in full. All questions were answered. Chlamydia treated with azithromycin  1000 mg PO  Lynwood Solomons 09/02/2024, 6:49 AM     [1]  Allergies Allergen Reactions   Peanut Butter Flavor [Flavoring Agent (Non-Screening)] Itching   Salmon [Fish Allergy]

## 2024-09-02 NOTE — Anesthesia Procedure Notes (Signed)
 Procedure Name: Intubation Date/Time: 09/02/2024 12:19 PM  Performed by: Arvell Edsel HERO, CRNAPre-anesthesia Checklist: Emergency Drugs available, Patient identified, Suction available, Timeout performed and Patient being monitored Patient Re-evaluated:Patient Re-evaluated prior to induction Oxygen Delivery Method: Circle system utilized Preoxygenation: Pre-oxygenation with 100% oxygen Induction Type: IV induction Ventilation: Mask ventilation without difficulty Laryngoscope Size: Mac and 3 Grade View: Grade I Tube type: Oral Tube size: 7.0 mm Number of attempts: 1 Airway Equipment and Method: Patient positioned with wedge pillow and Stylet Placement Confirmation: positive ETCO2, breath sounds checked- equal and bilateral and ETT inserted through vocal cords under direct vision Secured at: 22 cm Tube secured with: Tape Dental Injury: Teeth and Oropharynx as per pre-operative assessment

## 2024-09-02 NOTE — Anesthesia Postprocedure Evaluation (Signed)
"   Anesthesia Post Note  Patient: Angela Lara  Procedure(s) Performed: LAPAROSCOPY, WITH LEFT ECTOPIC PREGNANCY SURGICAL TREATMENT (Left: Abdomen) LAPAROSCOPIC LEFT , OVARIAN CYSTOMY (Left: Abdomen)     Patient location during evaluation: PACU Anesthesia Type: General Level of consciousness: awake and alert, oriented and patient cooperative Pain management: pain level controlled Vital Signs Assessment: post-procedure vital signs reviewed and stable Respiratory status: spontaneous breathing, nonlabored ventilation and respiratory function stable Cardiovascular status: blood pressure returned to baseline and stable Postop Assessment: no apparent nausea or vomiting Anesthetic complications: no   No notable events documented.  Last Vitals:  Vitals:   09/02/24 1400 09/02/24 1415  BP: 125/66 119/69  Pulse: 86 81  Resp: (!) 22 20  Temp:    SpO2: 100% 100%    Last Pain:  Vitals:   09/02/24 1352  TempSrc:   PainSc: Asleep                 [name]      "

## 2024-09-02 NOTE — Progress Notes (Signed)
 Came to discuss surgery again with patient. Given this is her last tube and no evidence of rupture, we discussed the option for salpingostomy. Reviewed this would preserve her tube, but there would still be concern for recurrence of ectopic due to the damage done by this ectopic and whatever underlying situation has caused her ectopics in the past. We discussed that if she has a salpingostomy, that we would want to follow her HCG levels to 0 and she may still need another dose of methotrexate . We discussed that if she wishes to have a salpingostomy, that it may not work and I may still have to remove the tube due to bleeding.   The patient reports she would like to have her tube removed completely and does not want to risk future ectopic pregnancy. Her support person with her checked to confirm this is what she wanted and patient stated she was sure.   Nevertheless, I told her I would plan for salpingectomy for now but if she changes her mind to let her nurse know and that I would adjust surgery.   About 30 minutes later, RN called me and patient would like me to try to preserve her tube.   Vina Solian, MD Attending Obstetrician & Gynecologist, [facility] for [facility], [facility]

## 2024-09-02 NOTE — Transfer of Care (Signed)
 Immediate Anesthesia Transfer of Care Note  Patient: Angela Lara  Procedure(s) Performed: LAPAROSCOPY, WITH LEFT ECTOPIC PREGNANCY SURGICAL TREATMENT (Left: Abdomen) LAPAROSCOPIC LEFT , OVARIAN CYSTOMY (Left: Abdomen)  Patient Location: PACU  Anesthesia Type:General  Level of Consciousness: drowsy and patient cooperative  Airway & Oxygen Therapy: Patient Spontanous Breathing and Patient connected to face mask oxygen  Post-op Assessment: Report given to RN, Post -op Vital signs reviewed and stable, Patient moving all extremities, and Patient moving all extremities X 4  Post vital signs: Reviewed and stable  Last Vitals:  Vitals Value Taken Time  BP 126/55 09/02/24 13:52  Temp    Pulse 84 09/02/24 13:55  Resp 22 09/02/24 13:55  SpO2 100 % 09/02/24 13:55  Vitals shown include unfiled device data.  Last Pain:  Vitals:   09/02/24 1148  TempSrc: Oral  PainSc:       Patients Stated Pain Goal: 0 (09/02/24 1124)  Complications: No notable events documented.

## 2024-09-02 NOTE — Op Note (Addendum)
 Angela Lara PROCEDURE DATE: 09/02/2024  PREOPERATIVE DIAGNOSIS: Live ectopic pregnancy POSTOPERATIVE DIAGNOSIS: Live tube ectopic pregnancy PROCEDURE: Laparoscopic left salpingostomy and removal of ectopic pregnancy, left ovarian cystectomy SURGEON:  Dr. Vina Solian ASSISTANT: Dr. Rollo Bring. An experienced assistant was required given the standard of surgical care given the complexity of the case.  This assistant was needed for exposure, dissection, suctioning, retraction, instrument exchange, and for overall help during the procedure.  ANESTHESIOLOGY TEAM: Anesthesiologist: Merla Almarie HERO, DO CRNA: Arvell Edsel HERO, CRNA; Claudene Irvine A, CRNA  INDICATIONS: 24 y.o. G2P0010 at [redacted]w[redacted]d here with the preoperative diagnoses as listed above.  Please refer to preoperative notes for more details. Patient was counseled regarding need for laparoscopic salpingectomy. Risks of surgery including bleeding which may require transfusion or reoperation, infection, injury to bowel or other surrounding organs, need for additional procedures including laparotomy and other postoperative/anesthesia complications were explained to patient.  Written informed consent was obtained.  FINDINGS: Dilated left fallopian tube containing ectopic gestation. Small normal appearing uterus, absent right fallopian tube. Normalright ovary. left ovary with surrounding filmy adhesions to pelvic side wall, to her tube and the sigmoid colon. The left ovary had a simple cyst.   ANESTHESIA: General, 0.25% bupivicaine local INTRAVENOUS FLUIDS: 1600 ml ESTIMATED BLOOD LOSS: 10 ml URINE OUTPUT: 50 ml SPECIMENS: Left ectopic gestation, Portions of left ovarian cyst wall.  COMPLICATIONS: None immediate  PROCEDURE IN DETAIL:  The patient was taken to the operating room where general anesthesia was administered and was found to be adequate.  She was placed in the dorsal lithotomy position, and was prepped and draped in a  sterile manner.  A Foley catheter was inserted into her bladder and attached to constant drainage and a uterine manipulator was then advanced into the uterus .    She was prepped and draped in the usual sterile fashion in the dorsal lithotomy position. A skin incision was made with the 11 blade scalpel in the umbilicus. I entered using the closed technique with the Veress needle. Opening pressure was 5.   Pneumoperitoneum achieved to a pressure of 15. A 5 mm Optiview port with the camera was used to enter the cavity under direct visualization. Below the point of entry and the pelvis was inspected and there was no evidence of injury. Hemoperitoneum noted in the pelvis. She was placed in steep Trendelenburg.  The scalpel was then used to make two 5mm incisions, one in the RLQ and one in the LLQ.  Two 5mm ports were inserted under direct visualization. We then also placed a port suprapubically for additional manipulation.   The Nezhat suction irrigator was then used to suction and irrigate the pelvis.  Attention was then turned to the left fallopian tube and ovary which were progressively detached from the surrounding adhesions using the Harmonic scalpel.  The tube was then injected with dilute vasopression (20 units in 100 cc of NS) for a total of 10 cc of this mixture. The tube was incised along the long axis using the harmonic. The pregnancy then easily everted out the tube. The tube was inspected and initially some bleeding. It was copiously irrigated. The pregnancy tissue was removed through the trocar under direct visualization. The tube was then inspected on a pressure of 8 and hemostatic. Attention was then turned to the left ovary and the cyst was incised using the Harmonic, drained and then portions of the cyst wall were removed. Arista was then applied to the ovarian wall but not to  the tube. The abdomen was desufflated, and all instruments were removed.    Incisions numbed with 10 cc of 0.25%  bupivacaine . The incisions were then closed in a subcuticular fashion with 4-0 vicryl and dressings were placed. She was taken to recovery in stable condition.  Rh status: positive   The patient will be discharged to home as per PACU criteria.  Discussed with her mom again that she will need HCGs to 0. Reviewed the importance of no unplanned pregnancies and tracking LMP when TTC.   The office has been messaged to ensure follow up.   Vina Solian MD, FACOG Obstetrician & Gynecologist, [facility] for [employer], [facility]

## 2024-09-02 NOTE — Anesthesia Preprocedure Evaluation (Addendum)
"                                    Anesthesia Evaluation  Patient identified by MRN, date of birth, ID band Patient awake    Reviewed: Allergy & Precautions, NPO status , Patient's Chart, lab work & pertinent test results  Airway Mallampati: II  TM Distance: >3 FB Neck ROM: Full    Dental  (+) Teeth Intact, Dental Advisory Given   Pulmonary neg pulmonary ROS   Pulmonary exam normal breath sounds clear to auscultation       Cardiovascular negative cardio ROS Normal cardiovascular exam Rhythm:Regular Rate:Normal     Neuro/Psych  PSYCHIATRIC DISORDERS  Depression    negative neurological ROS     GI/Hepatic negative GI ROS,,,(+)     substance abuse  marijuana use  Endo/Other    Class 3 obesity (BMI 43)  Renal/GU negative Renal ROS  negative genitourinary   Musculoskeletal negative musculoskeletal ROS (+)    Abdominal   Peds  Hematology  (+) Blood dyscrasia (hb 11.7, plt 392), anemia   Anesthesia Other Findings   Reproductive/Obstetrics Ectopic pregnancy, hx prior ectopic 2023                              Anesthesia Physical Anesthesia Plan  ASA: 2  Anesthesia Plan: General   Post-op Pain Management: Tylenol  PO (pre-op)*, Toradol  IV (intra-op)*, Precedex , Ketamine  IV* and Dilaudid  IV   Induction: Intravenous  PONV Risk Score and Plan: 4 or greater and Ondansetron , Dexamethasone , Midazolam , Scopolamine  patch - Pre-op and Treatment may vary due to age or medical condition  Airway Management Planned: Oral ETT  Additional Equipment: None  Intra-op Plan:   Post-operative Plan: Extubation in OR  Informed Consent: I have reviewed the patients History and Physical, chart, labs and discussed the procedure including the risks, benefits and alternatives for the proposed anesthesia with the patient or authorized representative who has indicated his/her understanding and acceptance.     Dental advisory given  Plan  Discussed with: CRNA  Anesthesia Plan Comments:          Anesthesia Quick Evaluation  "

## 2024-09-02 NOTE — MAU Provider Note (Signed)
 " History     CSN: [id number]  Arrival date and time: 09/02/24 9485   Event Date/Time   First Provider Initiated Contact with Patient 09/02/24 0617      Chief Complaint  Patient presents with   Back Pain   Pelvic Pain   Nausea   Angela Lara , a  24 y.o. G2P0010 at [redacted]w[redacted]d presents to MAU with complaints of abdominal pain and back pain following Methotrexate . Scheduled for repeat hCG on 09/03/24 after receiving methotrexate . Patient states that the pain worsened this morning and continues to get uncomfortable. Currently rates pain as a 8/10. Denies attempting to relieve symptoms. Denies abnormal discharge, and urinary symptoms.          OB History     Gravida  2   Para      Term      Preterm      AB  1   Living         SAB      IAB      Ectopic  1   Multiple      Live Births              Past Medical History:  Diagnosis Date   ADHD    Adjustment disorder    Deliberate self-cutting    Major depression    Suicide attempt (HCC) 05/06/2017   Wellbutrin    Past Surgical History:  Procedure Laterality Date   DIAGNOSTIC LAPAROSCOPY WITH REMOVAL OF ECTOPIC PREGNANCY N/A 12/12/2021   Procedure: DIAGNOSTIC LAPAROSCOPY WITH REMOVAL OF ECTOPIC PREGNANCY AND RIGHT SALPINGECTOMY;  Surgeon: Cleatus Moccasin, MD;  Location: [facility] OR;  Service: Gynecology;  Laterality: N/A;    Family History  Problem Relation Age of Onset   Depression Father    Drug abuse Father    ADD / ADHD Brother     Social History[1]  Allergies: Allergies[2]  Medications Prior to Admission  Medication Sig Dispense Refill Last Dose/Taking   ARIPiprazole  (ABILIFY ) 2 MG tablet Take 1 tablet (2 mg total) by mouth daily. (Patient not taking: Reported on 08/28/2024) 30 tablet 2    cefadroxil  (DURICEF) 500 MG capsule Take 1 capsule (500 mg total) by mouth 2 (two) times daily. 14 capsule 0    hydrOXYzine (VISTARIL) 25 MG capsule Take 25 mg by mouth at bedtime. (Patient not taking: Reported on  08/28/2024)      metroNIDAZOLE  (FLAGYL ) 500 MG tablet Take 1 tablet (500 mg total) by mouth 2 (two) times daily. 14 tablet 0    traZODone  (DESYREL ) 50 MG tablet Take 1 tablet (50 mg total) by mouth at bedtime. (Patient not taking: Reported on 08/28/2024) 30 tablet 2     Review of Systems  Constitutional:  Negative for chills, fatigue and fever.  Eyes:  Negative for pain and visual disturbance.  Respiratory:  Negative for apnea, shortness of breath and wheezing.   Cardiovascular:  Negative for chest pain and palpitations.  Gastrointestinal:  Negative for abdominal pain, constipation, diarrhea, nausea and vomiting.  Genitourinary:  Negative for difficulty urinating, dysuria, pelvic pain, vaginal bleeding, vaginal discharge and vaginal pain.  Musculoskeletal:  Negative for back pain.  Neurological:  Negative for seizures, weakness and headaches.  Psychiatric/Behavioral:  Negative for suicidal ideas.    Physical Exam   Blood pressure 129/75, pulse 86, temperature 98.5 F (36.9 C), temperature source Oral, resp. rate 18, last menstrual period 07/14/2024, SpO2 100%, unknown if currently breastfeeding.  Physical Exam Vitals and nursing note reviewed.  Constitutional:  General: She is not in acute distress.    Appearance: Normal appearance.  HENT:     Head: Normocephalic.  Pulmonary:     Effort: Pulmonary effort is normal.  Musculoskeletal:     Cervical back: Normal range of motion.  Skin:    General: Skin is warm and dry.  Neurological:     Mental Status: She is alert and oriented to person, place, and time.  Psychiatric:        Mood and Affect: Mood normal.     MAU Course  Procedures Orders Placed This Encounter  Procedures   US  OB Transvaginal   Urinalysis, Routine w reflex microscopic -Urine, Clean Catch   CBC   hCG, quantitative, pregnancy   Diet NPO time specified   ABO/Rh   Results for orders placed or performed during the hospital encounter of 09/02/24 (from the  past 24 hours)  Urinalysis, Routine w reflex microscopic -Urine, Clean Catch     Status: Abnormal   Collection Time: 09/02/24  5:28 AM  Result Value Ref Range   Color, Urine YELLOW YELLOW   APPearance HAZY (A) CLEAR   Specific Gravity, Urine 1.005 1.005 - 1.030   pH 6.0 5.0 - 8.0   Glucose, UA NEGATIVE NEGATIVE mg/dL   Hgb urine dipstick MODERATE (A) NEGATIVE   Bilirubin Urine NEGATIVE NEGATIVE   Ketones, ur NEGATIVE NEGATIVE mg/dL   Protein, ur NEGATIVE NEGATIVE mg/dL   Nitrite NEGATIVE NEGATIVE   Leukocytes,Ua SMALL (A) NEGATIVE   RBC / HPF 0-5 0 - 5 RBC/hpf   WBC, UA 0-5 0 - 5 WBC/hpf   Bacteria, UA RARE (A) NONE SEEN   Squamous Epithelial / HPF 6-10 0 - 5 /HPF   Hyaline Casts, UA PRESENT   CBC     Status: Abnormal   Collection Time: 09/02/24  5:37 AM  Result Value Ref Range   WBC 8.1 4.0 - 10.5 K/uL   RBC 4.61 3.87 - 5.11 MIL/uL   Hemoglobin 11.7 (L) 12.0 - 15.0 g/dL   HCT 66.9 (L) 63.9 - 53.9 %   MCV 71.6 (L) 80.0 - 100.0 fL   MCH 25.4 (L) 26.0 - 34.0 pg   MCHC 35.5 30.0 - 36.0 g/dL   RDW 82.7 (H) 88.4 - 84.4 %   Platelets 392 150 - 400 K/uL   nRBC 0.0 0.0 - 0.2 %  ABO/Rh     Status: None   Collection Time: 09/02/24  5:37 AM  Result Value Ref Range   ABO/RH(D) A POS    No rh immune globuloin      NOT A RH IMMUNE GLOBULIN CANDIDATE, PT RH POSITIVE Performed at [facility], [address] [place], [place] [address]   hCG, quantitative, pregnancy     Status: Abnormal   Collection Time: 09/02/24  5:37 AM  Result Value Ref Range   hCG, Beta Chain, Quant, S 18,328 (H) <5 mIU/mL   US  OB Transvaginal Addendum Date: 09/02/2024 ADDENDUM #1  ADDENDUM: Study discussed by telephone with MAU provider Emilio at [phone] hours on 09/02/2024. ---------------------------------------------------- Electronically signed by: Helayne Hurst MD 09/02/2024 06:28 AM EST RP Workstation: [id number]   Result Date: 09/02/2024 ADDENDUM #2 ADDENDUM: Study discussed by telephone with MAU  provider Emilio at [phone] hours on 09/02/2024. ---------------------------------------------------- Electronically signed by: Helayne Hurst MD 09/02/2024 06:28 AM EST RP Workstation: [id number] EXAM: OBSTETRIC ULTRASOUND FIRST TRIMESTER TECHNIQUE: Transvaginal first trimester obstetric pelvic duplex ultrasound was performed with real-time imaging, color flow Doppler imaging, and spectral  analysis. COMPARISON: US  OB Transvaginal 08/28/2024. CLINICAL HISTORY: Patient is a 24 year old female. Absent intrauterine gestational sac and suspicious left adnexal finding on ultrasound 5 days ago. Quantitative beta human chorionic gonadotropin  (hCG) increased from 9846 at that time to 17060 on 08/31/2024. FINDINGS: UTERUS: Benjamine appearance of the uterine endometrium. No focal myometrial mass. GESTATIONAL SAC(S): No intrauterine gestational sac.  See Left Ovarian abnormality. RIGHT OVARY: Right ovary is normal measuring 3.7 x 2.4 x 2.2 cm. Estimated volume: 10 mL. Normal arterial and venous flow. LEFT OVARY: Left ovarian associated gestational sac with both yolk sac and fetal pole now identified. See series 1 images 21 through 30. Fetal cardiac activity detected there of 82 beats per minute. 6 weeks and 1 day (based on 5.0 mm crown rump length). Left ovary plus ectopic measures 5.2 x 3.7 x 3.4 cm. Estimated volume: 34 mL. Adjacent left ovary otherwise within normal limits. Normal arterial and venous flow. FREE FLUID: No free fluid. IMPRESSION: 1. Live ectopic pregnancy identified at the left adnexa.  No pelvis free fluid. Electronically signed by: Helayne Hurst MD 09/02/2024 06:22 AM EST RP Workstation: [id number]    MDM - Concern for Live ectopic following US  results.  - Consulted Dr. Eveline who agrees with US  imaging.  - MD down to assess patient prior to surgery.  -  plan for left  salpingectomy of  - MD to place orders    Rozann Holts Erven) Emilio, MSN, CNM  [facility]  09/02/2024 11:09 AM      Assessment and Plan       [1]  Social History Tobacco Use   Smoking status: Never   Smokeless tobacco: Never  Vaping Use   Vaping status: Never Used  Substance Use Topics   Alcohol use: Not Currently   Drug use: Yes    Frequency: 1.0 times per week    Types: Marijuana    Comment: once every 2 weeks  [2]  Allergies Allergen Reactions   Peanut Butter Flavor [Flavoring Agent (Non-Screening)] Itching   Salmon [Fish Allergy]    "

## 2024-09-03 ENCOUNTER — Encounter (HOSPITAL_COMMUNITY): Payer: Self-pay | Admitting: Obstetrics and Gynecology

## 2024-09-03 ENCOUNTER — Ambulatory Visit: Payer: Self-pay | Admitting: Obstetrics & Gynecology

## 2024-09-03 LAB — SURGICAL PATHOLOGY

## 2024-09-04 ENCOUNTER — Other Ambulatory Visit: Payer: Self-pay

## 2024-09-04 ENCOUNTER — Telehealth (HOSPITAL_COMMUNITY): Payer: Self-pay

## 2024-09-04 ENCOUNTER — Ambulatory Visit (HOSPITAL_COMMUNITY): Payer: Self-pay

## 2024-09-04 DIAGNOSIS — O009 Unspecified ectopic pregnancy without intrauterine pregnancy: Secondary | ICD-10-CM

## 2024-09-04 MED ORDER — AZITHROMYCIN 500 MG PO TABS: 1000.0000 mg | ORAL_TABLET | Freq: Once | ORAL | 0 refills | Status: AC

## 2024-09-04 NOTE — Telephone Encounter (Signed)
 This patient tested positive for Chlamydia   She has NKDA , (Peanut butter and salmon),I have informed the patient of her results and confirmed her pharmacy is correct in her chart. Treatment has been sent per protocol.   Arlander Katheryn RAMAN, RN

## 2024-09-09 ENCOUNTER — Other Ambulatory Visit: Payer: Self-pay

## 2024-09-11 ENCOUNTER — Other Ambulatory Visit: Payer: Self-pay

## 2024-09-11 ENCOUNTER — Other Ambulatory Visit

## 2024-09-11 DIAGNOSIS — O009 Unspecified ectopic pregnancy without intrauterine pregnancy: Secondary | ICD-10-CM

## 2024-09-12 ENCOUNTER — Ambulatory Visit: Payer: Self-pay | Admitting: Obstetrics and Gynecology

## 2024-09-12 ENCOUNTER — Other Ambulatory Visit: Payer: Self-pay

## 2024-09-12 DIAGNOSIS — O009 Unspecified ectopic pregnancy without intrauterine pregnancy: Secondary | ICD-10-CM

## 2024-09-12 LAB — BETA HCG QUANT (REF LAB): hCG Quant: 90 m[IU]/mL

## 2024-09-16 ENCOUNTER — Other Ambulatory Visit

## 2024-09-18 ENCOUNTER — Other Ambulatory Visit

## 2024-09-23 ENCOUNTER — Other Ambulatory Visit: Payer: Self-pay

## 2024-09-25 ENCOUNTER — Other Ambulatory Visit

## 2024-09-30 ENCOUNTER — Ambulatory Visit: Payer: Self-pay | Admitting: Obstetrics and Gynecology

## 2024-10-07 ENCOUNTER — Other Ambulatory Visit: Payer: Self-pay
